# Patient Record
Sex: Female | Born: 1985 | Race: Black or African American | Hispanic: No | Marital: Single | Smoking: Never smoker
Health system: Southern US, Community
[De-identification: ages and names within clinical notes are randomized; demographics above are authoritative.]

## PROBLEM LIST (undated history)

## (undated) DIAGNOSIS — L309 Dermatitis, unspecified: Secondary | ICD-10-CM

## (undated) HISTORY — DX: Dermatitis, unspecified: L30.9

## (undated) HISTORY — PX: NO PAST SURGERIES: SHX2092

## (undated) HISTORY — PX: APPENDECTOMY: SHX54

---
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
## 2011-12-26 ENCOUNTER — Emergency Department (HOSPITAL_COMMUNITY)
Admission: EM | Admit: 2011-12-26 | Discharge: 2011-12-26 | Disposition: A | Discharge status: Home Health | Payer: Self-pay | Attending: Emergency Medicine | Admitting: Emergency Medicine

## 2011-12-26 ENCOUNTER — Encounter (HOSPITAL_COMMUNITY): Payer: Self-pay | Admitting: Nurse Practitioner

## 2011-12-26 DIAGNOSIS — K0889 Other specified disorders of teeth and supporting structures: Secondary | ICD-10-CM

## 2011-12-26 DIAGNOSIS — K029 Dental caries, unspecified: Secondary | ICD-10-CM | POA: Insufficient documentation

## 2011-12-26 MED ORDER — IBUPROFEN 200 MG PO TABS
600.0000 mg | ORAL_TABLET | Freq: Once | ORAL | Status: AC
  Administered 2011-12-26: 600 mg via ORAL
  Filled 2011-12-26: qty 1

## 2011-12-26 MED ORDER — PENICILLIN V POTASSIUM 250 MG PO TABS
500.0000 mg | ORAL_TABLET | Freq: Once | ORAL | Status: AC
  Administered 2011-12-26: 500 mg via ORAL
  Filled 2011-12-26: qty 2

## 2011-12-26 MED ORDER — PENICILLIN V POTASSIUM 500 MG PO TABS: 500.0000 mg | ORAL_TABLET | Freq: Three times a day (TID) | ORAL | Status: AC

## 2011-12-26 MED ORDER — IBUPROFEN 800 MG PO TABS: 800.0000 mg | ORAL_TABLET | Freq: Three times a day (TID) | ORAL | Status: AC

## 2011-12-26 NOTE — ED Notes (Signed)
Woke this am with top R tooth ache and facial swelling. No trouble breathing or swallowing

## 2011-12-26 NOTE — ED Provider Notes (Addendum)
History     CSN: [id number]  Arrival date & time 12/26/11  1416   First MD Initiated Contact with Patient 12/26/11 1507      Chief Complaint  Patient presents with  . Dental Pain    (Consider location/radiation/quality/duration/timing/severity/associated sxs/prior treatment) HPI Complaint toothache, right upper molar onset upon awakening this morning pain is mild at present . Associated symptoms included right-sided facial swelling no trismus no fever no other complaint nothing makes symptoms better or worse pain is nonradiating dull in quality no treatment prior to coming here History reviewed. No pertinent past medical history. Past medical. Negative History reviewed. No pertinent past surgical history.  History reviewed. No pertinent family history.  History  Substance Use Topics  . Smoking status: Never Smoker   . Smokeless tobacco: Not on file  . Alcohol Use: No    OB History    Grav Para Term Preterm Abortions TAB SAB Ect Mult Living                  Review of Systems  Constitutional: Negative.   HENT: Positive for facial swelling.        Toothache  Eyes: Negative.   Respiratory: Negative.   Gastrointestinal: Negative.   Musculoskeletal: Negative.   Skin: Negative.   Neurological: Negative.   Psychiatric/Behavioral: Negative.   All other systems reviewed and are negative.    Allergies  Review of patient's allergies indicates no known allergies.  Home Medications  No current outpatient prescriptions on file.  BP 123/81  Pulse 83  Temp(Src) 98.2 F (36.8 C) (Oral)  Resp 14  Ht 5\' 1"  (1.549 m)  Wt 115 lb (52.164 kg)  BMI 21.73 kg/m2  SpO2 100%  Physical Exam  Nursing note and vitals reviewed. Constitutional: She appears well-developed and well-nourished. No distress.  HENT:  Head: Normocephalic and atraumatic.       Right upper molar with dental carry, no redness or fluctuance of gingiva no trismus tooth is minimally tender, minimal swelling  of right cheek. No malocclusion  Eyes: Conjunctivae are normal. Pupils are equal, round, and reactive to light.  Neck: Neck supple. No tracheal deviation present. No thyromegaly present.  Cardiovascular: Normal rate.   Pulmonary/Chest: Effort normal.  Abdominal: She exhibits no distension.  Musculoskeletal: Normal range of motion. She exhibits no edema and no tenderness.  Lymphadenopathy:    She has no cervical adenopathy.  Neurological: She is alert. Coordination normal.  Skin: Skin is warm and dry. No rash noted.  Psychiatric: She has a normal mood and affect.    ED Course  Procedures (including critical care time)  Labs Reviewed - No data to display No results found.   No diagnosis found.    MDM  Suspect periapical abscess Plan prescriptions ibuprofen, penicillin. Dental referral Dr. Ninetta Lights Diagnosis toothache        Doug Sou, MD 12/26/11 1517  Doug Sou, MD 12/27/11 [id number]

## 2011-12-26 NOTE — Discharge Instructions (Signed)
Toothache Toothaches are usually caused by tooth decay (cavity). However, other causes of toothache include:  Gum disease.   Cracked tooth.   Cracked filling.   Injury.   Jaw problem (temporo mandibular joint or TMJ disorder).   Tooth abscess.   Root sensitivity.   Grinding.   Eruption problems.  Swelling and redness around a painful tooth often means you have a dental abscess. Pain medicine and antibiotics can help reduce symptoms, but you will need to see a dentist within the next few days to have your problem properly evaluated and treated. If tooth decay is the problem, you may need a filling or root canal to save your tooth. If the problem is more severe, your tooth may need to be pulled. SEEK IMMEDIATE MEDICAL CARE IF:  You cannot swallow.   You develop severe swelling, increased redness, or increased pain in your mouth or face.   You have a fever.   You cannot open your mouth adequately.  Document Released: 10/15/2004 Document Revised: 08/27/2011 Document Reviewed: 12/05/2009 [facility] Patient Information 2012 [place], [place].   Call Dr. Ninetta Lights in 2 days to get your tooth fixed. You must call in 2 days to qualify for possible help with payment of your dental bill

## 2014-05-10 ENCOUNTER — Emergency Department (HOSPITAL_COMMUNITY)
Admission: EM | Admit: 2014-05-10 | Discharge: 2014-05-10 | Disposition: A | Discharge status: Home / Self Care | Payer: Self-pay | Attending: Emergency Medicine | Admitting: Emergency Medicine

## 2014-05-10 ENCOUNTER — Encounter (HOSPITAL_COMMUNITY): Payer: Self-pay | Admitting: Emergency Medicine

## 2014-05-10 DIAGNOSIS — K047 Periapical abscess without sinus: Secondary | ICD-10-CM | POA: Insufficient documentation

## 2014-05-10 DIAGNOSIS — K089 Disorder of teeth and supporting structures, unspecified: Secondary | ICD-10-CM | POA: Insufficient documentation

## 2014-05-10 MED ORDER — OXYCODONE-ACETAMINOPHEN 5-325 MG PO TABS: ORAL_TABLET | ORAL | Status: DC

## 2014-05-10 MED ORDER — AMOXICILLIN 500 MG PO CAPS: 500.0000 mg | ORAL_CAPSULE | Freq: Three times a day (TID) | ORAL | Status: DC

## 2014-05-10 MED ORDER — IBUPROFEN 400 MG PO TABS
800.0000 mg | ORAL_TABLET | Freq: Once | ORAL | Status: AC
  Administered 2014-05-10: 800 mg via ORAL
  Filled 2014-05-10: qty 2

## 2014-05-10 NOTE — Discharge Instructions (Signed)
Take percocet for breakthrough pain, do not drink alcohol, drive, care for children or do other critical tasks while taking percocet. ° °Return to the emergency room for fever, change in vision, redness to the face that rapidly spreads towards the eye, nausea or vomiting, difficulty swallowing or shortness of breath. °  °Apply warm compresses to jaw throughout the day.  ° ° °Take your antibiotics as directed and to the end of the course. DO NOT drink alcohol when taking metronidazole, it will make you very sick!  ° °Followup with a dentist is very important for ongoing evaluation and management of recurrent dental pain. Return to emergency department for emergent changing or worsening symptoms." ° °Low-cost dental clinic: °**Kaylee  Gibson  at [phone]**  °**Kaylee Gibson at [phone] [address]**   ° °You may also call [phone] ° °Dental Assistance °If the dentist on-call cannot see you, please use the resources below: ° ° °Patients with Medicaid: [facility] °[address], [phone] °[address], [phone] ° °If unable to pay, or uninsured, contact [facility] ([phone]) or [facility] ([phone] in [place], [phone] in [place]) to become qualified for the adult dental clinic ° °Other Low-Cost Community Dental Services: °[facility]- [address], [place], [place], [address] °   [phone], Ext. [phone] °   2nd and 4th Thursday of the month at 6:30am °   10 clients each day by appointment, can sometimes see walk-in     patients if someone does not show for an appointment °[facility]- [address], [place], [place], [address] °   [phone] °[facility]- [address], [place], [place], [address] °   [phone] ° °[facility]- [phone] °[facility]- [phone] °[facility]- [phone] ° °

## 2014-05-10 NOTE — ED Provider Notes (Signed)
CSN: [id number]     Arrival date & time 05/10/14  1224 History   First MD Initiated Contact with Patient 05/10/14 1246     Chief Complaint  Patient presents with  . Dental Pain     (Consider location/radiation/quality/duration/timing/severity/associated sxs/prior Treatment) HPI  Kaylee Gibson is a 28 y.o. female complaining of pain and swelling to right upper jaw worsening over the course of 2 days. Patient denies change in vision, pain with eye movement, fever/chills, difficulty opening jaw, difficulty swallowing, SOB, gum swelling, facial swelling, neck swelling.    History reviewed. No pertinent past medical history. History reviewed. No pertinent past surgical history. History reviewed. No pertinent family history. History  Substance Use Topics  . Smoking status: Never Smoker   . Smokeless tobacco: Not on file  . Alcohol Use: No   OB History   Grav Para Term Preterm Abortions TAB SAB Ect Mult Living                 Review of Systems  10 systems reviewed and found to be negative, except as noted in the HPI.   Allergies  Review of patient's allergies indicates no known allergies.  Home Medications   Prior to Admission medications   Medication Sig Start Date End Date Taking? Authorizing Provider  amoxicillin (AMOXIL) 500 MG capsule Take 1 capsule (500 mg total) by mouth 3 (three) times daily. 05/10/14   Damion Kant, PA-C  oxyCODONE-acetaminophen (PERCOCET/ROXICET) 5-325 MG per tablet 1 to 2 tabs PO q6hrs  PRN for pain 05/10/14   [facility], PA-C   BP 110/73  Pulse 83  Temp(Src) 98.4 F (36.9 C) (Oral)  Resp 18  Ht 5\' 1"  (1.549 m)  Wt 106 lb (48.081 kg)  BMI 20.04 kg/m2  SpO2 100%  LMP 04/09/2014 Physical Exam  Nursing note and vitals reviewed. Constitutional: She is oriented to person, place, and time. She appears well-developed and well-nourished. No distress.  HENT:  Head: Normocephalic.  Mouth/Throat: Oropharynx is clear and moist.    Right  maxillary swelling, this is not extend to the eye.  Eyes: Conjunctivae and EOM are normal. Pupils are equal, round, and reactive to light.  Neck: Normal range of motion. Neck supple.  Cardiovascular: Normal rate, regular rhythm and intact distal pulses.   Pulmonary/Chest: Effort normal and breath sounds normal. No stridor. No respiratory distress. She has no wheezes. She has no rales. She exhibits no tenderness.  Abdominal: Soft. Bowel sounds are normal. She exhibits no distension and no mass. There is no tenderness. There is no rebound and no guarding.  Musculoskeletal: Normal range of motion.  Neurological: She is alert and oriented to person, place, and time.  Psychiatric: She has a normal mood and affect.    ED Course  Procedures (including critical care time)  INCISION AND DRAINAGE Performed by: Wynetta Emery Consent: Verbal consent obtained. Risks and benefits: risks, benefits and alternatives were discussed Type: abscess  Body area: Right maxillary  Anesthesia: local infiltration  Incision was made with a scalpel.  Local anesthetic: lidocaine 2% with epinephrine  Anesthetic total: 0.5 ml  Complexity: complex Blunt dissection to break up loculations  Drainage: purulent  Drainage amount: Profuse   Packing material: None   Patient tolerance: Patient tolerated the procedure well with no immediate complications.    Labs Review Labs Reviewed - No data to display  Imaging Review No results found.   EKG Interpretation None      MDM   Final diagnoses:  Dental abscess    Filed Vitals:   05/10/14 1242 05/10/14 1408  BP: 109/65 110/73  Pulse: 91 83  Temp: 98.4 F (36.9 C)   TempSrc: Oral   Resp: 20 18  Height: 5\' 1"  (1.549 m)   Weight: 106 lb (48.081 kg)   SpO2: 100% 100%    Medications  ibuprofen (ADVIL,MOTRIN) tablet 800 mg (800 mg Oral Given 05/10/14 1421)    Kaylee Gibson is a 28 y.o. female presenting with dental pain and dental  abscess to right maxilla. I&D performed with expression of a large amount of purulent fluid. Patient will be started on amoxicillin and asked to follow with dentistry.  Evaluation does not show pathology that would require ongoing emergent intervention or inpatient treatment. Pt is hemodynamically stable and mentating appropriately. Discussed findings and plan with patient/guardian, who agrees with care plan. All questions answered. Return precautions discussed and outpatient follow up given.   Discharge Medication List as of 05/10/2014  1:51 PM    START taking these medications   Details  amoxicillin (AMOXIL) 500 MG capsule Take 1 capsule (500 mg total) by mouth 3 (three) times daily., Starting 05/10/2014, Until Discontinued, Print    oxyCODONE-acetaminophen (PERCOCET/ROXICET) 5-325 MG per tablet 1 to 2 tabs PO q6hrs  PRN for pain, Print             Wynetta Emeryicole Neya Creegan, PA-C 05/10/14 1653

## 2014-05-10 NOTE — ED Notes (Signed)
Pt c/o of dental pain on upper right side of mouth for past 2 days. Pt noted to have swelling on R side. Pt rates pain 9/10. Pt sts she has taken goody's powder to try and relieve pain. Pt sts goody's relieved a small amount of pain. Pt sts she has had diarrhea when pain first started on Tuesday.

## 2014-05-12 NOTE — ED Provider Notes (Signed)
Medical screening examination/treatment/procedure(s) were performed by non-physician practitioner and as supervising physician I was immediately available for consultation/collaboration.   EKG Interpretation None        Pecola Haxton, DO 05/12/14 1556 

## 2014-12-09 ENCOUNTER — Emergency Department (HOSPITAL_COMMUNITY)
Admission: EM | Admit: 2014-12-09 | Discharge: 2014-12-09 | Disposition: A | Discharge status: Home / Self Care | Payer: Self-pay | Attending: Emergency Medicine | Admitting: Emergency Medicine

## 2014-12-09 ENCOUNTER — Encounter (HOSPITAL_COMMUNITY): Payer: Self-pay | Admitting: *Deleted

## 2014-12-09 DIAGNOSIS — R21 Rash and other nonspecific skin eruption: Secondary | ICD-10-CM | POA: Insufficient documentation

## 2014-12-09 DIAGNOSIS — Z792 Long term (current) use of antibiotics: Secondary | ICD-10-CM | POA: Insufficient documentation

## 2014-12-09 MED ORDER — HYDROCORTISONE 1 % EX CREA: TOPICAL_CREAM | CUTANEOUS | Status: DC

## 2014-12-09 MED ORDER — DIPHENHYDRAMINE HCL 25 MG PO TABS: 25.0000 mg | ORAL_TABLET | Freq: Four times a day (QID) | ORAL | Status: DC

## 2014-12-09 NOTE — ED Provider Notes (Signed)
CSN: [id number]     Arrival date & time 12/09/14  1445 History  This chart was scribed for non-physician practitioner, , Trixie Dredge, PA-C, working with Samuel Jester, DO, by Modena Jansky, ED Scribe. This patient was seen in room TR08C/TR08C and the patient's care was started at 3:13 PM.  Chief Complaint  Patient presents with  . Rash    face   The history is provided by the patient. No language interpreter was used.    HPI Comments: Kaylee Gibson is a 29 y.o. female who presents to the Emergency Department complaining of a constant moderate rash that started about a week ago. She reports that she is unsure of the cause of the rash. She describes the rash as itchy and goes across her cheek and nose. She denies any pain, swelling or rashes anywhere else.  Denies any pain associated with the rash.  No itching or swelling in the mouth or throat, no difficulty swallowing or breathing. Denies changes in personal care products including detergents, soaps, shampoos, lotions, perfumes. Denies new clothing or furniture.  Denies travel, visiting other people's houses.  Denies any recent camping or time spent in the woods.  Denies known tick bites.  Denies chemical or plant exposures.  Denies new foods.  Denies any new medications or medication changes.    History reviewed. No pertinent past medical history. History reviewed. No pertinent past surgical history. History reviewed. No pertinent family history. History  Substance Use Topics  . Smoking status: Never Smoker   . Smokeless tobacco: Not on file  . Alcohol Use: No   OB History    No data available     Review of Systems  Constitutional: Negative for fever and chills.  HENT: Negative for facial swelling, sore throat and trouble swallowing.   Respiratory: Negative for cough, shortness of breath and stridor.   Skin: Positive for rash. Negative for color change and wound.  Allergic/Immunologic: Negative for immunocompromised state.   Neurological: Negative for headaches.  Hematological: Does not bruise/bleed easily.  Psychiatric/Behavioral: Negative for self-injury.      Allergies  Review of patient's allergies indicates no known allergies.  Home Medications   Prior to Admission medications   Medication Sig Start Date End Date Taking? Authorizing Provider  amoxicillin (AMOXIL) 500 MG capsule Take 1 capsule (500 mg total) by mouth 3 (three) times daily. 05/10/14   Nicole Pisciotta, PA-C  oxyCODONE-acetaminophen (PERCOCET/ROXICET) 5-325 MG per tablet 1 to 2 tabs PO q6hrs  PRN for pain 05/10/14   Nicole Pisciotta, PA-C   BP 108/90 mmHg  Pulse 84  Temp(Src) 98.1 F (36.7 C) (Oral)  Resp 16  Ht  (1.549 m)  Wt 102 lb (46.267 kg)  BMI 19.28 kg/m2  SpO2 10%  LMP 12/02/2014 Physical Exam  Constitutional: She appears well-developed and well-nourished. No distress.  HENT:  Head: Normocephalic and atraumatic.  Mouth/Throat: Oropharynx is clear and moist. No oropharyngeal exudate.  No edema in oropharynx.   Eyes: Conjunctivae and EOM are normal.  Neck: Normal range of motion. Neck supple.  Cardiovascular: Normal rate and regular rhythm.   Pulmonary/Chest: Effort normal. No stridor. No respiratory distress. She has no wheezes. She has no rales.  Lymphadenopathy:    She has no cervical adenopathy.  Neurological: She is alert.  Skin: Rash noted. She is not diaphoretic.  Fine papules across the forehead, and along the bridge of nose and bilateral cheeks.   Psychiatric: She has a normal mood and affect. Her behavior  is normal.  Nursing note and vitals reviewed.   ED Course  Procedures (including critical care time) DIAGNOSTIC STUDIES:   COORDINATION OF CARE: 3:17 PM- Pt advised of plan for treatment and pt agrees.  Labs Review Labs Reviewed - No data to display  Imaging Review No results found.   EKG Interpretation None      MDM   Final diagnoses:  Facial rash    Afebrile, nontoxic  patient with pruritic rash across nose and cheeks and forehead.  No e/o superinfection.  No airway concerns.  Unclear etiology.  No systemic symptoms.   D/C home with hydrocortisone, benadryl.  PCP follow up.  Discussed result, findings, treatment, and follow up  with patient.  Pt given return precautions.  Pt verbalizes understanding and agrees with plan.        I personally performed the services described in this documentation, which was scribed in my presence. The recorded information has been reviewed and is accurate.      Trixie Dredgemily Aodhan Scheidt, PA-C 12/09/14 1534  Samuel JesterKathleen McManus, DO 12/10/14 1340

## 2014-12-09 NOTE — ED Notes (Signed)
Declined W/C at D/C and was escorted to lobby by RN. 

## 2014-12-09 NOTE — ED Notes (Signed)
Pt reports a rash on face for one week .pt reports itching .

## 2014-12-09 NOTE — Discharge Instructions (Signed)
Read the information below.  Use the prescribed medication as directed.  Please discuss all new medications with your pharmacist.  You may return to the Emergency Department at any time for worsening condition or any new symptoms that concern you.   If you develop redness, swelling, pus draining from the wound, or fevers greater than 100.4, return to the ER immediately for a recheck.  If you develop high fevers, difficulty swallowing or breathing, or you are unable to tolerate fluids by mouth, return to the ER immediately for a recheck.      Rash A rash is a change in the color or feel of your skin. There are many different types of rashes. You may have other problems along with your rash. HOME CARE  Avoid the thing that caused your rash.  Do not scratch your rash.  You may take cools baths to help stop itching.  Only take medicines as told by your doctor.  Keep all doctor visits as told. GET HELP RIGHT AWAY IF:   Your pain, puffiness (swelling), or redness gets worse.  You have a fever.  You have new or severe problems.  You have body aches, watery poop (diarrhea), or you throw up (vomit).  Your rash is not better after 3 days. MAKE SURE YOU:   Understand these instructions.  Will watch your condition.  Will get help right away if you are not doing well or get worse. Document Released: 02/24/2008 Document Revised: 11/30/2011 Document Reviewed: 06/22/2011 [facility] Patient Information 2015 [place]. This information is not intended to replace advice given to you by your health care provider. Make sure you discuss any questions you have with your health care provider.

## 2015-04-07 ENCOUNTER — Encounter (HOSPITAL_COMMUNITY): Payer: Self-pay | Admitting: Emergency Medicine

## 2015-04-07 ENCOUNTER — Emergency Department (HOSPITAL_COMMUNITY)
Admission: EM | Admit: 2015-04-07 | Discharge: 2015-04-07 | Disposition: A | Discharge status: Home / Self Care | Payer: Self-pay | Attending: Emergency Medicine | Admitting: Emergency Medicine

## 2015-04-07 DIAGNOSIS — L500 Allergic urticaria: Secondary | ICD-10-CM | POA: Insufficient documentation

## 2015-04-07 DIAGNOSIS — X58XXXA Exposure to other specified factors, initial encounter: Secondary | ICD-10-CM | POA: Insufficient documentation

## 2015-04-07 DIAGNOSIS — Y9389 Activity, other specified: Secondary | ICD-10-CM | POA: Insufficient documentation

## 2015-04-07 DIAGNOSIS — Z792 Long term (current) use of antibiotics: Secondary | ICD-10-CM | POA: Insufficient documentation

## 2015-04-07 DIAGNOSIS — Z79899 Other long term (current) drug therapy: Secondary | ICD-10-CM | POA: Insufficient documentation

## 2015-04-07 DIAGNOSIS — Y9289 Other specified places as the place of occurrence of the external cause: Secondary | ICD-10-CM | POA: Insufficient documentation

## 2015-04-07 DIAGNOSIS — T7840XA Allergy, unspecified, initial encounter: Secondary | ICD-10-CM

## 2015-04-07 DIAGNOSIS — Y998 Other external cause status: Secondary | ICD-10-CM | POA: Insufficient documentation

## 2015-04-07 MED ORDER — HYDROXYZINE HCL 10 MG PO TABS
10.0000 mg | ORAL_TABLET | Freq: Once | ORAL | Status: AC
  Administered 2015-04-07: 10 mg via ORAL
  Filled 2015-04-07: qty 1

## 2015-04-07 MED ORDER — PREDNISONE 20 MG PO TABS
40.0000 mg | ORAL_TABLET | Freq: Every day | ORAL | Status: DC
Start: 2015-04-07 — End: 2016-01-16

## 2015-04-07 MED ORDER — FAMOTIDINE IN NACL 20-0.9 MG/50ML-% IV SOLN
20.0000 mg | Freq: Once | INTRAVENOUS | Status: AC
  Administered 2015-04-07: 20 mg via INTRAVENOUS
  Filled 2015-04-07: qty 50

## 2015-04-07 MED ORDER — METHYLPREDNISOLONE SODIUM SUCC 125 MG IJ SOLR
125.0000 mg | Freq: Once | INTRAMUSCULAR | Status: AC
  Administered 2015-04-07: 125 mg via INTRAVENOUS
  Filled 2015-04-07: qty 2

## 2015-04-07 NOTE — ED Provider Notes (Signed)
CSN: [id number]     Arrival date & time 04/07/15  1823 History   First MD Initiated Contact with Patient 04/07/15 1828     Chief Complaint  Patient presents with  . Allergic Reaction  . Rash     (Consider location/radiation/quality/duration/timing/severity/associated sxs/prior Treatment) HPI Comments: Patient presents to the emergency department with chief complaint of allergic reaction. She states that when she woke today, she had a rash on her left bicep. She dates that it then spread to her bilateral upper and lower extremities and to her trunk. She states that it is very itchy. She tried taking 50 mg of Benadryl about an hour ago with no relief. She denies any difficulty breathing. Denies difficulty swallowing or speaking. She denies any associated nausea, vomiting, or diarrhea. Denies any known allergies. Denies any allergic contacts. Denies any new soaps, detergents, or other attentional sources of contact dermatitis.  The history is provided by the patient. No language interpreter was used.    History reviewed. No pertinent past medical history. History reviewed. No pertinent past surgical history. No family history on file. History  Substance Use Topics  . Smoking status: Never Smoker   . Smokeless tobacco: Not on file  . Alcohol Use: No   OB History    No data available     Review of Systems  Constitutional: Negative for fever and chills.  Respiratory: Negative for shortness of breath.   Cardiovascular: Negative for chest pain.  Gastrointestinal: Negative for nausea, vomiting, diarrhea and constipation.  Genitourinary: Negative for dysuria.  Skin: Positive for rash.  All other systems reviewed and are negative.     Allergies  Review of patient's allergies indicates no known allergies.  Home Medications   Prior to Admission medications   Medication Sig Start Date End Date Taking? Authorizing Provider  amoxicillin (AMOXIL) 500 MG capsule Take 1 capsule (500 mg  total) by mouth 3 (three) times daily. 05/10/14   Nicole Pisciotta, PA-C  diphenhydrAMINE (BENADRYL) 25 MG tablet Take 1 tablet (25 mg total) by mouth every 6 (six) hours. 12/09/14   Trixie DredgeEmily West, PA-C  hydrocortisone cream 1 % Apply to affected area 2 times daily 12/09/14   Trixie DredgeEmily West, PA-C  oxyCODONE-acetaminophen (PERCOCET/ROXICET) 5-325 MG per tablet 1 to 2 tabs PO q6hrs  PRN for pain 05/10/14   Joni ReiningNicole Pisciotta, PA-C   BP 111/79 mmHg  Pulse 87  Temp(Src) 98.2 F (36.8 C) (Oral)  Resp 12  SpO2 100%  LMP 03/17/2015 (Approximate) Physical Exam  Constitutional: She is oriented to person, place, and time. She appears well-developed and well-nourished.  HENT:  Head: Normocephalic and atraumatic.  Oropharynx is clear, no edema, no stridor, airway is intact  Eyes: Conjunctivae and EOM are normal. Pupils are equal, round, and reactive to light.  Neck: Normal range of motion. Neck supple.  Cardiovascular: Normal rate and regular rhythm.  Exam reveals no gallop and no friction rub.   No murmur heard. Pulmonary/Chest: Effort normal and breath sounds normal. No respiratory distress. She has no wheezes. She has no rales. She exhibits no tenderness.  Clear to auscultation bilaterally  Abdominal: Soft. Bowel sounds are normal. She exhibits no distension and no mass. There is no tenderness. There is no rebound and no guarding.  Musculoskeletal: Normal range of motion. She exhibits no edema or tenderness.  Neurological: She is alert and oriented to person, place, and time.  Skin: Skin is warm and dry.  Diffuse urticaria on upper and lower extremities and trunk  Psychiatric: She has a normal mood and affect. Her behavior is normal. Judgment and thought content normal.  Nursing note and vitals reviewed.   ED Course  Procedures (including critical care time) Labs Review Labs Reviewed - No data to display  Imaging Review No results found.   EKG Interpretation None      MDM   Final diagnoses:   Allergic reaction, initial encounter    Patient with diffuse urticaria on extremities and on trunk. Airway is intact, no respiratory distress, no other systems involved other than the skin. No evidence of anaphylaxis. Will treat with Solu-Medrol and Pepcid IV. Patient has taken Benadryl.  8:40 PM patient reassessed, she states that she is feeling well. The itching has resolved. Urticaria has resolved. Will discharge to home with instructions to continue Benadryl and to take prednisone for the next 5 days. Return precautions discussed. Patient understands and agrees the plan. She is stable and ready for discharge.    Roxy Horseman, PA-C 04/07/15 1610  Derwood Kaplan, MD 04/11/15 [phone]

## 2015-04-07 NOTE — ED Notes (Signed)
PA at bedside.

## 2015-04-07 NOTE — ED Notes (Signed)
Pt arrives via ems for c/o possible allergic reaction with red, itchy rash on legs and torso. Pt denies sob or feelings of her throat swelling. Pt did take  benadryl prior to arrival. Alert, oriented, nad.

## 2015-04-07 NOTE — Discharge Instructions (Signed)
Hives Hives are itchy, red, swollen areas of the skin. They can vary in size and location on your body. Hives can come and go for hours or several days (acute hives) or for several weeks (chronic hives). Hives do not spread from person to person (noncontagious). They may get worse with scratching, exercise, and emotional stress. CAUSES   Allergic reaction to food, additives, or drugs.  Infections, including the common cold.  Illness, such as vasculitis, lupus, or thyroid disease.  Exposure to sunlight, heat, or cold.  Exercise.  Stress.  Contact with chemicals. SYMPTOMS   Red or white swollen patches on the skin. The patches may change size, shape, and location quickly and repeatedly.  Itching.  Swelling of the hands, feet, and face. This may occur if hives develop deeper in the skin. DIAGNOSIS  Your caregiver can usually tell what is wrong by performing a physical exam. Skin or blood tests may also be done to determine the cause of your hives. In some cases, the cause cannot be determined. TREATMENT  Mild cases usually get better with medicines such as antihistamines. Severe cases may require an emergency epinephrine injection. If the cause of your hives is known, treatment includes avoiding that trigger.  HOME CARE INSTRUCTIONS   Avoid causes that trigger your hives.  Take antihistamines as directed by your caregiver to reduce the severity of your hives. Non-sedating or low-sedating antihistamines are usually recommended. Do not drive while taking an antihistamine.  Take any other medicines prescribed for itching as directed by your caregiver.  Wear loose-fitting clothing.  Keep all follow-up appointments as directed by your caregiver. SEEK MEDICAL CARE IF:   You have persistent or severe itching that is not relieved with medicine.  You have painful or swollen joints. SEEK IMMEDIATE MEDICAL CARE IF:   You have a fever.  Your tongue or lips are swollen.  You have  trouble breathing or swallowing.  You feel tightness in the throat or chest.  You have abdominal pain. These problems may be the first sign of a life-threatening allergic reaction. Call your local emergency services (911 in [place] MAKE SURE YOU:   Understand these instructions.  Will watch your condition.  Will get help right away if you are not doing well or get worse. Document Released: 09/07/2005 Document Revised: 09/12/2013 Document Reviewed: 12/01/2011 Exit[employer] Patient Information 2015 [employer]. This information is not intended to replace advice given to you by your health care provider. Make sure you discuss any questions you have with your health care provider.  

## 2016-01-14 ENCOUNTER — Ambulatory Visit: Payer: Self-pay | Admitting: Internal Medicine

## 2016-01-15 ENCOUNTER — Telehealth: Payer: Self-pay | Admitting: General Practice

## 2016-01-15 NOTE — Telephone Encounter (Signed)
APPT. REMINDER CALL, LMTCB °

## 2016-01-16 ENCOUNTER — Ambulatory Visit (INDEPENDENT_AMBULATORY_CARE_PROVIDER_SITE_OTHER): Payer: Self-pay | Admitting: Internal Medicine

## 2016-01-16 ENCOUNTER — Encounter: Payer: Self-pay | Admitting: Internal Medicine

## 2016-01-16 VITALS — BP 107/73 | HR 78 | Temp 98.2°F | Ht 61.9 in | Wt 100.6 lb

## 2016-01-16 DIAGNOSIS — L209 Atopic dermatitis, unspecified: Secondary | ICD-10-CM

## 2016-01-16 DIAGNOSIS — Z0000 Encounter for general adult medical examination without abnormal findings: Secondary | ICD-10-CM

## 2016-01-16 DIAGNOSIS — R21 Rash and other nonspecific skin eruption: Secondary | ICD-10-CM

## 2016-01-16 MED ORDER — CETIRIZINE HCL 10 MG PO CAPS: 10.0000 | ORAL_CAPSULE | Freq: Every morning | ORAL | Status: DC

## 2016-01-16 MED ORDER — HYDROCORTISONE 0.5 % EX CREA: 1.0000 "application " | TOPICAL_CREAM | Freq: Three times a day (TID) | CUTANEOUS | Status: DC

## 2016-01-16 MED ORDER — HYDROXYZINE HCL 10 MG PO TABS: 10.0000 mg | ORAL_TABLET | Freq: Three times a day (TID) | ORAL | Status: DC | PRN

## 2016-01-16 MED ORDER — CETIRIZINE HCL 10 MG PO CAPS: 1.0000 | ORAL_CAPSULE | Freq: Every morning | ORAL | Status: DC

## 2016-01-16 NOTE — Patient Instructions (Addendum)
Take zyrtec 10mg  daily every day.   You can use atarax 10mg  up to three times a day as needed for itching.   Apply hydrocortisone 0.5% cream to rash three times a day, do not use for longer than 1 month. Once you apply hydrocortisone you can place a dry dressing over it to keep it covered so that you do not scratch it.

## 2016-01-16 NOTE — Assessment & Plan Note (Signed)
Pt presenting w/ confluent papular rash in the center of forehead and on cheeks beneath eyes that she has had for 1 year. She admits to constantly scratching it w/o thinking about it. She has tried topical and oral benadryl which has not helped. She has been prescribed steroids in the past but she said that these did not help w/ rash. Rash is not scaling and spares nasolabial fold and there is not any scaling around her scalp or eye brows that would suggest seborrheic dermatitis. Rash is smooth and sun exposure only causes rash to itch more and not actually worsen rash thus unlikely lupus. Unlikely eczema, as she doesn't have any rashes on flexural surfaces of her body.  She has hx of allergies since she was a child and does not take a daily allergy med. Likely she has atopic dermatitis vs lichen simplex chronicus from constant scratching.   - rx for zyrtec 10mg  daily, hydrocortisone 0.05% applied topically TID, and atarax 10mg  TID prn for itch - advised to put a dry dressing over rash after applying steroid cream to prevent rubbing of skin

## 2016-01-16 NOTE — Progress Notes (Signed)
Subjective:   Patient ID: Kaylee Gibson female   DOB: 07-Aug-1986 30 y.o.   MRN: [id number]  HPI: [name] is a 30 y.o. with past medical history as outlined below who presents to clinic to establish care. She has a chronic facial rash that has been present x 1 year. She has been seen in the ED for this at least once a year per EPIC review and has tried po and topical steroids. She has also tried benadryl  BID po as well as topical form which has not helped. She has had allergies since she was a child and is not taking any anti histamine for this. She endorses itchy throat and eyes, eye redness, sneezing, and rhinorrhea associated w/ her allergies. Wynelle Link exposure makes her skin rash itchy but does not worsen it. She has no other rashes on her body. She does not smoke, does not have any pets, and is not aware of any house mold. She does not wear any make up and washes her face with dial soap.   SH: unemployed, lives w/ mother. Not sexually active. Does not smoke, drink EtOH, or use illicit drugs Family hx: niece and nephew have asthma, aunt has bronchitis. Neg for DM, HTN, bleeding d/o, and lupus Surgical hx: none PMHx: none except asthma  Please see problem list for status of the pt's chronic medical problems.  No past medical history on file. Current Outpatient Prescriptions  Medication Sig Dispense Refill  . Cetirizine HCl 10 MG CAPS Take 1 capsule (10 mg total) by mouth every morning. 30 capsule 11  . hydrocortisone cream 0.5 % Apply 1 application topically 3 (three) times daily. 30 g 0  . hydrOXYzine (ATARAX/VISTARIL) 10 MG tablet Take 1 tablet (10 mg total) by mouth 3 (three) times daily as needed. 30 tablet 0   No current facility-administered medications for this visit.   No family history on file. Social History   Social History  . Marital Status: Single    Spouse Name: N/A  . Number of Children: N/A  . Years of Education: N/A   Social History Main Topics  .  Smoking status: Never Smoker   . Smokeless tobacco: Not on file  . Alcohol Use: No  . Drug Use: No  . Sexual Activity: Not on file   Other Topics Concern  . Not on file   Social History Narrative   Review of Systems: Review of Systems  Constitutional: Negative for fever, chills, weight loss and malaise/fatigue.  Eyes: Positive for redness (associated w/ allergies). Negative for blurred vision.  Respiratory: Negative for shortness of breath.   Cardiovascular: Positive for palpitations (occasional). Negative for chest pain.  Gastrointestinal: Negative for nausea, vomiting, abdominal pain, diarrhea and constipation.  Skin: Positive for itching and rash (chronic skin rash x 1 year on face only).  Neurological: Negative for focal weakness, weakness and headaches.  Endo/Heme/Allergies: Positive for environmental allergies (states she has had allergies since she was a child).    Objective:  Physical Exam: Filed Vitals:   01/16/16 1329  BP: 107/73  Pulse: 78  Temp: 98.2 F (36.8 C)  TempSrc: Oral  Height: 5' 1.9" (1.572 m)  Weight: 100 lb 9.6 oz (45.632 kg)  SpO2: 100%   Physical Exam  Constitutional: She appears well-developed and well-nourished. No distress.  HENT:  Head: Normocephalic and atraumatic.  Nose: Nose normal.  Mouth/Throat: Oropharynx is clear and moist. No oropharyngeal exudate.  Eyes: EOM are normal.  Injected conjunctiva on  the medial side b/l   Cardiovascular: Normal rate, regular rhythm and normal heart sounds.  Exam reveals no gallop and no friction rub.   No murmur heard. Pulmonary/Chest: Effort normal and breath sounds normal. No respiratory distress. She has no wheezes. She has no rales.  Abdominal: Soft. Bowel sounds are normal. She exhibits no distension. There is no tenderness. There is no rebound and no guarding.  Skin: Skin is warm and dry. Rash (multiple confluent papules on center of forehead and benealth eyes b/l. neg for scaling of skin, rash  is smooth and does not affect nasolabial folds.) noted. She is not diaphoretic. No erythema. No pallor.  Psychiatric: She has a normal mood and affect. Her behavior is normal. Judgment and thought content normal.   Assessment & Plan:   Please see problem based assessment and plan.

## 2016-01-16 NOTE — Assessment & Plan Note (Signed)
Pt has never had a pap smear. She is currently on her menstrual cycle, denies irregular menses. Advised to schedule an appointment for pap smear.

## 2016-01-19 NOTE — Progress Notes (Signed)
Case discussed with Dr. Truong at the time of the visit.  We reviewed the resident's history and exam and pertinent patient test results.  I agree with the assessment, diagnosis, and plan of care documented in the resident's note. 

## 2016-02-04 ENCOUNTER — Ambulatory Visit: Payer: Self-pay | Admitting: Internal Medicine

## 2016-02-13 ENCOUNTER — Ambulatory Visit: Payer: Self-pay | Admitting: Internal Medicine

## 2016-02-18 ENCOUNTER — Telehealth: Payer: Self-pay | Admitting: *Deleted

## 2016-02-18 NOTE — Telephone Encounter (Signed)
Message and return call to patient.  Patient would like to schedule an appointment for a Pap Smear.  [facility]  Angelina OkGladys Terrick Allred, RN 02/18/2016 1:43 PM.

## 2016-03-08 ENCOUNTER — Ambulatory Visit (HOSPITAL_COMMUNITY)
Admission: EM | Admit: 2016-03-08 | Discharge: 2016-03-08 | Disposition: A | Discharge status: Home / Self Care | Payer: Self-pay | Attending: Family Medicine | Admitting: Family Medicine

## 2016-03-08 ENCOUNTER — Encounter (HOSPITAL_COMMUNITY): Payer: Self-pay | Admitting: Emergency Medicine

## 2016-03-08 DIAGNOSIS — T700XXA Otitic barotrauma, initial encounter: Secondary | ICD-10-CM

## 2016-03-08 DIAGNOSIS — R05 Cough: Secondary | ICD-10-CM

## 2016-03-08 DIAGNOSIS — R059 Cough, unspecified: Secondary | ICD-10-CM

## 2016-03-08 DIAGNOSIS — J302 Other seasonal allergic rhinitis: Secondary | ICD-10-CM

## 2016-03-08 DIAGNOSIS — R0982 Postnasal drip: Secondary | ICD-10-CM

## 2016-03-08 MED ORDER — PREDNISONE 20 MG PO TABS
ORAL_TABLET | ORAL | Status: DC
Start: 2016-03-08 — End: 2017-11-02

## 2016-03-08 NOTE — ED Provider Notes (Signed)
CSN: [id number]     Arrival date & time 03/08/16  1807 History   First MD Initiated Contact with Patient 03/08/16 1916     Chief Complaint  Patient presents with  . Sore Throat  . Otalgia  . Cough   (Consider location/radiation/quality/duration/timing/severity/associated sxs/prior Treatment) HPI Comments: 30 year old female complaining of PND, headache, head fullness, earaches, cough for 3 days. Denies fevers.  Patient is a 30 y.o. female presenting with pharyngitis, ear pain, and cough.  Sore Throat Pertinent negatives include no shortness of breath.  Otalgia Associated symptoms: congestion, cough, rhinorrhea and sore throat   Associated symptoms: no fever, no neck pain and no rash   Cough Associated symptoms: ear pain, rhinorrhea and sore throat   Associated symptoms: no chills, no fever, no rash and no shortness of breath     History reviewed. No pertinent past medical history. History reviewed. No pertinent past surgical history. History reviewed. No pertinent family history. Social History  Substance Use Topics  . Smoking status: Never Smoker   . Smokeless tobacco: None  . Alcohol Use: No   OB History    No data available     Review of Systems  Constitutional: Negative for fever, chills, activity change, appetite change and fatigue.  HENT: Positive for congestion, ear pain, postnasal drip, rhinorrhea, sinus pressure and sore throat. Negative for facial swelling.   Eyes: Negative.   Respiratory: Positive for cough. Negative for shortness of breath.   Cardiovascular: Negative.   Musculoskeletal: Negative for neck pain and neck stiffness.  Skin: Negative for pallor and rash.  Neurological: Negative.     Allergies  Review of patient's allergies indicates no known allergies.  Home Medications   Prior to Admission medications   Medication Sig Start Date End Date Taking? Authorizing Provider  Cetirizine HCl 10 MG CAPS Take 1 capsule (10 mg total) by mouth every  morning. 01/16/16   Denton Brickiana M Truong, MD  hydrocortisone cream 0.5 % Apply 1 application topically 3 (three) times daily. 01/16/16   Denton Brickiana M Truong, MD  hydrOXYzine (ATARAX/VISTARIL) 10 MG tablet Take 1 tablet (10 mg total) by mouth 3 (three) times daily as needed. 01/16/16   Denton Brickiana M Truong, MD  predniSONE (DELTASONE) 20 MG tablet Take 2 tabs po on first day, 2 tabs second day, 2 tabs third day, 1 tab fourth day, 1 tab 5th day. Take with food. 03/08/16   Hayden Rasmussenavid Sitlali Koerner, NP   Meds Ordered and Administered this Visit  Medications - No data to display  BP 120/83 mmHg  Pulse 97  Temp(Src) 99.5 F (37.5 C) (Oral)  Resp 12  SpO2 100%  LMP 02/23/2016 (Exact Date) No data found.   Physical Exam  Constitutional: She is oriented to person, place, and time. She appears well-developed and well-nourished. No distress.  HENT:  Mouth/Throat: No oropharyngeal exudate.  Bilateral TMs are pearly grey entrance lucent. Retracted. No erythema or effusion. Oropharynx with minor erythema and cobblestoning with mild amount of clear PND. No exudates  Eyes: Conjunctivae and EOM are normal.  Neck: Normal range of motion. Neck supple.  Cardiovascular: Normal rate, regular rhythm and normal heart sounds.   Pulmonary/Chest: Effort normal and breath sounds normal. No respiratory distress. She has no wheezes. She has no rales.  Musculoskeletal: Normal range of motion. She exhibits no edema.  Lymphadenopathy:    She has no cervical adenopathy.  Neurological: She is alert and oriented to person, place, and time.  Skin: Skin is warm and dry. No rash  noted.  Psychiatric: She has a normal mood and affect.  Nursing note and vitals reviewed.   ED Course  Procedures (including critical care time)  Labs Review Labs Reviewed - No data to display  Imaging Review No results found.   Visual Acuity Review  Right Eye Distance:   Left Eye Distance:   Bilateral Distance:    Right Eye Near:   Left Eye Near:    Bilateral  Near:         MDM   1. Other seasonal allergic rhinitis   2. PND (post-nasal drip)   3. Cough   4. Barotitis media, initial encounter    For nasal and head congestion may take Sudafed PE 10 mg every 4 hours as needed. Saline nasal spray used frequently. For drainage may use Allegra, Claritin or Zyrtec. If you need stronger medicine to stop drainage may take Chlor-Trimeton 2-4 mg every 4 hours. This may cause drowsiness. Ibuprofen 400 mg every 6 hours as needed for pain, discomfort or fever. Meds ordered this encounter  Medications  . predniSONE (DELTASONE) 20 MG tablet    Sig: Take 2 tabs po on first day, 2 tabs second day, 2 tabs third day, 1 tab fourth day, 1 tab 5th day. Take with food.    Dispense:  8 tablet    Refill:  0    Order Specific Question:  Supervising Provider    Answer:  Linna Hoff [[id number]]       Hayden Rasmussen, NP 03/08/16 2023

## 2016-03-08 NOTE — Discharge Instructions (Signed)
Allergic Rhinitis For nasal and head congestion may take Sudafed PE 10 mg every 4 hours as needed. Saline nasal spray used frequently. For drainage may use Allegra, Claritin or Zyrtec. If you need stronger medicine to stop drainage may take Chlor-Trimeton 2-4 mg every 4 hours. This may cause drowsiness. Ibuprofen 400 mg every 6 hours as needed for pain, discomfort or fever. Drink plenty of fluids and stay well-hydrated.  Allergic rhinitis is when the mucous membranes in the nose respond to allergens. Allergens are particles in the air that cause your body to have an allergic reaction. This causes you to release allergic antibodies. Through a chain of events, these eventually cause you to release histamine into the blood stream. Although meant to protect the body, it is this release of histamine that causes your discomfort, such as frequent sneezing, congestion, and an itchy, runny nose.  CAUSES Seasonal allergic rhinitis (hay fever) is caused by pollen allergens that may come from grasses, trees, and weeds. Year-round allergic rhinitis (perennial allergic rhinitis) is caused by allergens such as house dust mites, pet dander, and mold spores. SYMPTOMS  Nasal stuffiness (congestion).  Itchy, runny nose with sneezing and tearing of the eyes. DIAGNOSIS Your health care provider can help you determine the allergen or allergens that trigger your symptoms. If you and your health care provider are unable to determine the allergen, skin or blood testing may be used. Your health care provider will diagnose your condition after taking your health history and performing a physical exam. Your health care provider may assess you for other related conditions, such as asthma, pink eye, or an ear infection. TREATMENT Allergic rhinitis does not have a cure, but it can be controlled by:  Medicines that block allergy symptoms. These may include allergy shots, nasal sprays, and oral antihistamines.  Avoiding the  allergen. Hay fever may often be treated with antihistamines in pill or nasal spray forms. Antihistamines block the effects of histamine. There are over-the-counter medicines that may help with nasal congestion and swelling around the eyes. Check with your health care provider before taking or giving this medicine. If avoiding the allergen or the medicine prescribed do not work, there are many new medicines your health care provider can prescribe. Stronger medicine may be used if initial measures are ineffective. Desensitizing injections can be used if medicine and avoidance does not work. Desensitization is when a patient is given ongoing shots until the body becomes less sensitive to the allergen. Make sure you follow up with your health care provider if problems continue. HOME CARE INSTRUCTIONS It is not possible to completely avoid allergens, but you can reduce your symptoms by taking steps to limit your exposure to them. It helps to know exactly what you are allergic to so that you can avoid your specific triggers. SEEK MEDICAL CARE IF:  You have a fever.  You develop a cough that does not stop easily (persistent).  You have shortness of breath.  You start wheezing.  Symptoms interfere with normal daily activities.   This information is not intended to replace advice given to you by your health care provider. Make sure you discuss any questions you have with your health care provider.   Document Released: 06/02/2001 Document Revised: 09/28/2014 Document Reviewed: 05/15/2013 Elsevier Interactive Patient Education 2016 Elsevier Inc.  Cough, Adult A cough helps to clear your throat and lungs. A cough may last only 2-3 weeks (acute), or it may last longer than 8 weeks (chronic). Many different things can  cause a cough. A cough may be a sign of an illness or another medical condition. HOME CARE  Pay attention to any changes in your cough.  Take medicines only as told by your  doctor.  If you were prescribed an antibiotic medicine, take it as told by your doctor. Do not stop taking it even if you start to feel better.  Talk with your doctor before you try using a cough medicine.  Drink enough fluid to keep your pee (urine) clear or pale yellow.  If the air is dry, use a cold steam vaporizer or humidifier in your home.  Stay away from things that make you cough at work or at home.  If your cough is worse at night, try using extra pillows to raise your head up higher while you sleep.  Do not smoke, and try not to be around smoke. If you need help quitting, ask your doctor.  Do not have caffeine.  Do not drink alcohol.  Rest as needed. GET HELP IF:  You have new problems (symptoms).  You cough up yellow fluid (pus).  Your cough does not get better after 2-3 weeks, or your cough gets worse.  Medicine does not help your cough and you are not sleeping well.  You have pain that gets worse or pain that is not helped with medicine.  You have a fever.  You are losing weight and you do not know why.  You have night sweats. GET HELP RIGHT AWAY IF:  You cough up blood.  You have trouble breathing.  Your heartbeat is very fast.   This information is not intended to replace advice given to you by your health care provider. Make sure you discuss any questions you have with your health care provider.   Document Released: 05/21/2011 Document Revised: 05/29/2015 Document Reviewed: 11/14/2014 Elsevier Interactive Patient Education 2016 [employer].  [employer] Barotitis media is inflammation of your middle ear. This occurs when the auditory tube (eustachian tube) leading from the back of your nose (nasopharynx) to your eardrum is blocked. This blockage may result from a cold, environmental allergies, or an upper respiratory infection. Unresolved barotitis media may lead to damage or hearing loss (barotrauma), which may become permanent. HOME CARE  INSTRUCTIONS   Use medicines as recommended by your health care provider. Over-the-counter medicines will help unblock the canal and can help during times of air travel.  Do not put anything into your ears to clean or unplug them. Eardrops will not be helpful.  Do not swim, dive, or fly until your health care provider says it is all right to do so. If these activities are necessary, chewing gum with frequent, forceful swallowing may help. It is also helpful to hold your nose and gently blow to pop your ears for equalizing pressure changes. This forces air into the eustachian tube.  Only take over-the-counter or prescription medicines for pain, discomfort, or fever as directed by your health care provider.  A decongestant may be helpful in decongesting the middle ear and make pressure equalization easier. SEEK MEDICAL CARE IF:  You experience a serious form of dizziness in which you feel as if the room is spinning and you feel nauseated (vertigo).  Your symptoms only involve one ear. SEEK IMMEDIATE MEDICAL CARE IF:   You develop a severe headache, dizziness, or severe ear pain.  You have bloody or pus-like drainage from your ears.  You develop a fever.  Your problems do not improve or become  worse. MAKE SURE YOU:   Understand these instructions.  Will watch your condition.  Will get help right away if you are not doing well or get worse.   This information is not intended to replace advice given to you by your health care provider. Make sure you discuss any questions you have with your health care provider.   Document Released: 09/04/2000 Document Revised: 06/28/2013 Document Reviewed: 04/04/2013 Elsevier Interactive Patient Education [employer]

## 2016-03-08 NOTE — ED Notes (Signed)
[facility] a [facility] sore throat, bilateral ear pain and a cough x 3 days. The patient denied running a fever.

## 2016-09-16 ENCOUNTER — Encounter (HOSPITAL_COMMUNITY): Payer: Self-pay | Admitting: Nurse Practitioner

## 2016-09-16 ENCOUNTER — Emergency Department (HOSPITAL_COMMUNITY)
Admission: EM | Admit: 2016-09-16 | Discharge: 2016-09-16 | Disposition: A | Discharge status: Home / Self Care | Payer: Self-pay | Attending: Emergency Medicine | Admitting: Emergency Medicine

## 2016-09-16 DIAGNOSIS — H6591 Unspecified nonsuppurative otitis media, right ear: Secondary | ICD-10-CM

## 2016-09-16 DIAGNOSIS — Z79899 Other long term (current) drug therapy: Secondary | ICD-10-CM | POA: Insufficient documentation

## 2016-09-16 DIAGNOSIS — H61891 Other specified disorders of right external ear: Secondary | ICD-10-CM | POA: Insufficient documentation

## 2016-09-16 MED ORDER — AMOXICILLIN 500 MG PO CAPS: 500.0000 mg | ORAL_CAPSULE | Freq: Three times a day (TID) | ORAL | 0 refills | Status: DC

## 2016-09-16 NOTE — ED Provider Notes (Signed)
[facility] Provider Note    By signing my name below, I, Earmon PhoenixJennifer Waddell, attest that this documentation has been prepared under the direction and in the presence of Sharilyn SitesLisa Westyn Driggers, PA-C. Electronically Signed: Earmon PhoenixJennifer Waddell, ED Scribe. 09/16/16. 12:45 PM.    History   Chief Complaint Chief Complaint  Patient presents with  . Otalgia    The history is provided by the patient and medical records. No language interpreter was used.    HPI Comments:  Kaylee Gibson is a 30 y.o. female who presents to the Emergency Department complaining of right ear pain that began about three days ago. She states she feels a popping sensation and then pain in her right ear only. She has not taken anything for pain. She denies modifying factors. She denies fever, chills, nausea, vomiting or drainage from the ear.   No past medical history on file.  Patient Active Problem List   Diagnosis Date Noted  . Atopic dermatitis 01/16/2016  . Preventative health care 01/16/2016    No past surgical history on file.  OB History    No data available       Home Medications    Prior to Admission medications   Medication Sig Start Date End Date Taking? Authorizing Provider  Cetirizine HCl 10 MG CAPS Take 1 capsule (10 mg total) by mouth every morning. 01/16/16   Denton Brickiana M Truong, MD  hydrocortisone cream 0.5 % Apply 1 application topically 3 (three) times daily. 01/16/16   Denton Brickiana M Truong, MD  hydrOXYzine (ATARAX/VISTARIL) 10 MG tablet Take 1 tablet (10 mg total) by mouth 3 (three) times daily as needed. 01/16/16   Denton Brickiana M Truong, MD  predniSONE (DELTASONE) 20 MG tablet Take 2 tabs po on first day, 2 tabs second day, 2 tabs third day, 1 tab fourth day, 1 tab 5th day. Take with food. 03/08/16   Hayden Rasmussenavid Mabe, NP    Family History No family history on file.  Social History Social History  Substance Use Topics  . Smoking status: Never Smoker  . Smokeless tobacco: Not on file  . Alcohol use No      Allergies   Patient has no known allergies.   Review of Systems Review of Systems  Constitutional: Negative for chills and fever.  HENT: Positive for ear pain. Negative for ear discharge.   Gastrointestinal: Negative for nausea and vomiting.  All other systems reviewed and are negative.    Physical Exam Updated Vital Signs BP 119/77 (BP Location: Left Arm)   Pulse 89   Temp 97.8 F (36.6 C) (Oral)   Resp 20   SpO2 100%   Physical Exam  Constitutional: She is oriented to person, place, and time. She appears well-developed and well-nourished.  HENT:  Head: Normocephalic and atraumatic.  Right Ear: A middle ear effusion is present.  Left Ear: Tympanic membrane normal.  Mouth/Throat: Uvula is midline, oropharynx is clear and moist and mucous membranes are normal.  Right middle ear effusion, slight erythema of the canal,TM appears intact without signs of perforation, mastoid non-tender Left ear normal  Eyes: Conjunctivae and EOM are normal. Pupils are equal, round, and reactive to light.  Neck: Normal range of motion.  Cardiovascular: Normal rate, regular rhythm and normal heart sounds.   Pulmonary/Chest: Effort normal and breath sounds normal.  Abdominal: Soft. Bowel sounds are normal.  Musculoskeletal: Normal range of motion.  Neurological: She is alert and oriented to person, place, and time.  Skin: Skin is warm and dry.  Psychiatric: She has a normal mood and affect.  Nursing note and vitals reviewed.    ED Treatments / Results  DIAGNOSTIC STUDIES: Oxygen Saturation is 100% on RA, normal by my interpretation.   COORDINATION OF CARE: 12:38 PM- Will prescribe Amoxicillin. Pt verbalizes understanding and agrees to plan.  Medications - No data to display  Labs (all labs ordered are listed, but only abnormal results are displayed) Labs Reviewed - No data to display  EKG  EKG Interpretation None       Radiology No results  found.  Procedures Procedures (including critical care time)  Medications Ordered in ED Medications - No data to display   Initial Impression / Assessment and Plan / ED Course  I have reviewed the triage vital signs and the nursing notes.  Pertinent labs & imaging results that were available during my care of the patient were reviewed by me and considered in my medical decision making (see chart for details).  Clinical Course    30 y.o. F here with right ear pain.  She is afebrile, non-toxic.  On exam she has right middle ear effusion without signs of perforation.  Slight erythema of the canal.  Mastoid non-tender.  Remainder of exam is benign.  Will start on amoxicillin.  Encouraged to follow-up with PCP if not improving in the next few days.  Discussed plan with patient, he/she acknowledged understanding and agreed with plan of care.  Return precautions given for new or worsening symptoms.  Final Clinical Impressions(s) / ED Diagnoses   Final diagnoses:  Middle ear effusion, right    New Prescriptions Discharge Medication List as of 09/16/2016 12:49 PM    START taking these medications   Details  amoxicillin (AMOXIL) 500 MG capsule Take 1 capsule (500 mg total) by mouth 3 (three) times daily., Starting Wed 09/16/2016, Print       I personally performed the services described in this documentation, which was scribed in my presence. The recorded information has been reviewed and is accurate.   Garlon HatchetLisa M Lacrystal Barbe, PA-C 09/16/16 1418    Rolland PorterMark James, MD 09/24/16 [phone]

## 2016-09-16 NOTE — Discharge Instructions (Signed)
Take the prescribed medication as directed.  May take tylenol or motrin for pain. Follow-up with your primary care doctor if not improving. Return to the ED for new or worsening symptoms.

## 2016-09-16 NOTE — ED Triage Notes (Signed)
Pt endorses right ear pain x3 days without drainage, trauma or hearing impairment. Patient states pain is better with ibuprofen.

## 2016-10-02 ENCOUNTER — Encounter (HOSPITAL_COMMUNITY): Payer: Self-pay | Admitting: *Deleted

## 2016-10-02 ENCOUNTER — Ambulatory Visit (HOSPITAL_COMMUNITY)
Admission: EM | Admit: 2016-10-02 | Discharge: 2016-10-02 | Disposition: A | Discharge status: Home / Self Care | Payer: Self-pay | Attending: Family Medicine | Admitting: Family Medicine

## 2016-10-02 DIAGNOSIS — H6981 Other specified disorders of Eustachian tube, right ear: Secondary | ICD-10-CM

## 2016-10-02 DIAGNOSIS — Z9189 Other specified personal risk factors, not elsewhere classified: Secondary | ICD-10-CM

## 2016-10-02 MED ORDER — IPRATROPIUM BROMIDE 0.06 % NA SOLN: 2.0000 | Freq: Four times a day (QID) | NASAL | 12 refills | Status: DC

## 2016-10-02 MED ORDER — METHYLPREDNISOLONE ACETATE 80 MG/ML IJ SUSP
INTRAMUSCULAR | Status: AC
  Filled 2016-10-02: qty 1

## 2016-10-02 MED ORDER — METHYLPREDNISOLONE ACETATE 80 MG/ML IJ SUSP
80.0000 mg | Freq: Once | INTRAMUSCULAR | Status: AC
  Administered 2016-10-02: 80 mg via INTRAMUSCULAR

## 2016-10-02 NOTE — ED Triage Notes (Signed)
Was  Seen   Sev  Weeks    For  Ear  Infection  Took  All  meds  Still  Has   Symptoms     Also  Has  A  Rash     That  Started  6  Days  Ago    itches

## 2016-10-02 NOTE — ED Provider Notes (Signed)
[facility]    CSN: [id number] Arrival date & time: 10/02/16  1013     History   Chief Complaint Chief Complaint  Patient presents with  . Otalgia    HPI Kaylee Gibson is a 31 y.o. female.   The history is provided by the patient.  Otalgia  Location:  Right Behind ear:  No abnormality Quality:  Pressure Onset quality:  Gradual Duration:  1 week Progression:  Unchanged Chronicity:  New Context comment:  Seen recently in ER and given amox without relief of sx. Associated symptoms: congestion and rash     History reviewed. No pertinent past medical history.  Patient Active Problem List   Diagnosis Date Noted  . Atopic dermatitis 01/16/2016  . Preventative health care 01/16/2016    History reviewed. No pertinent surgical history.  OB History    No data available       Home Medications    Prior to Admission medications   Medication Sig Start Date End Date Taking? Authorizing Provider  amoxicillin (AMOXIL) 500 MG capsule Take 1 capsule (500 mg total) by mouth 3 (three) times daily. 09/16/16   Garlon Hatchet, PA-C  Cetirizine HCl 10 MG CAPS Take 1 capsule (10 mg total) by mouth every morning. 01/16/16   Denton Brick, MD  hydrocortisone cream 0.5 % Apply 1 application topically 3 (three) times daily. 01/16/16   Denton Brick, MD  hydrOXYzine (ATARAX/VISTARIL) 10 MG tablet Take 1 tablet (10 mg total) by mouth 3 (three) times daily as needed. 01/16/16   Denton Brick, MD  ipratropium (ATROVENT) 0.06 % nasal spray Place 2 sprays into the nose 4 (four) times daily. 10/02/16   Linna Hoff, MD  predniSONE (DELTASONE) 20 MG tablet Take 2 tabs po on first day, 2 tabs second day, 2 tabs third day, 1 tab fourth day, 1 tab 5th day. Take with food. 03/08/16   Hayden Rasmussen, NP    Family History History reviewed. No pertinent family history.  Social History Social History  Substance Use Topics  . Smoking status: Never Smoker  . Smokeless tobacco: Never Used    . Alcohol use No     Allergies   Patient has no known allergies.   Review of Systems Review of Systems  Constitutional: Negative.   HENT: Positive for congestion, ear pain and postnasal drip.   Respiratory: Negative.   Cardiovascular: Negative.   Skin: Positive for rash.     Physical Exam Triage Vital Signs ED Triage Vitals  Enc Vitals Group     BP 10/02/16 1028 103/70     Pulse Rate 10/02/16 1028 93     Resp 10/02/16 1028 12     Temp 10/02/16 1028 99.3 F (37.4 C)     Temp Source 10/02/16 1028 Oral     SpO2 10/02/16 1028 99 %     Weight --      Height --      Head Circumference --      Peak Flow --      Pain Score 10/02/16 1034 0     Pain Loc --      Pain Edu? --      Excl. in GC? --    No data found.   Updated Vital Signs BP 103/70 (BP Location: Left Arm)   Pulse 93   Temp 99.3 F (37.4 C) (Oral)   Resp 12   LMP 09/20/2016   SpO2 99%   Visual Acuity Right  Eye Distance:   Left Eye Distance:   Bilateral Distance:    Right Eye Near:   Left Eye Near:    Bilateral Near:     Physical Exam  Constitutional: She appears well-developed and well-nourished.  HENT:  Right Ear: External ear normal.  Left Ear: External ear normal.  Nose: Nose normal.  Mouth/Throat: Oropharynx is clear and moist.  Eyes: Conjunctivae are normal. Pupils are equal, round, and reactive to light.  Neck: Normal range of motion. Neck supple.  Lymphadenopathy:    She has no cervical adenopathy.  Skin: Rash noted.  Nursing note and vitals reviewed.    UC Treatments / Results  Labs (all labs ordered are listed, but only abnormal results are displayed) Labs Reviewed - No data to display  EKG  EKG Interpretation None       Radiology No results found.  Procedures Procedures (including critical care time)  Medications Ordered in UC Medications  methylPREDNISolone acetate (DEPO-MEDROL) injection 80 mg (80 mg Intramuscular Given 10/02/16 1056)     Initial  Impression / Assessment and Plan / UC Course  I have reviewed the triage vital signs and the nursing notes.  Pertinent labs & imaging results that were available during my care of the patient were reviewed by me and considered in my medical decision making (see chart for details).       Final Clinical Impressions(s) / UC Diagnoses   Final diagnoses:  ETD (Eustachian tube dysfunction), right  At risk for allergic reaction to medication    New Prescriptions Discharge Medication List as of 10/02/2016 10:52 AM    START taking these medications   Details  ipratropium (ATROVENT) 0.06 % nasal spray Place 2 sprays into the nose 4 (four) times daily., Starting Fri 10/02/2016, Normal         Linna HoffJames D Bacilio Abascal, MD 10/20/16 [phone]

## 2016-10-02 NOTE — ED Notes (Signed)
Pt reports     Symptoms       Of  The  Rash   Itches  As   Well

## 2016-10-02 NOTE — Discharge Instructions (Signed)
No more antibiotic , use medicine as prescribed, see your doctor as needed.

## 2016-10-28 ENCOUNTER — Other Ambulatory Visit (HOSPITAL_COMMUNITY)
Admission: RE | Admit: 2016-10-28 | Discharge: 2016-10-28 | Disposition: A | Discharge status: Home / Self Care | Payer: Self-pay | Source: Ambulatory Visit | Attending: Internal Medicine | Admitting: Internal Medicine

## 2016-10-28 ENCOUNTER — Encounter: Payer: Self-pay | Admitting: Internal Medicine

## 2016-10-28 ENCOUNTER — Ambulatory Visit (INDEPENDENT_AMBULATORY_CARE_PROVIDER_SITE_OTHER): Payer: Self-pay | Admitting: Internal Medicine

## 2016-10-28 ENCOUNTER — Encounter (INDEPENDENT_AMBULATORY_CARE_PROVIDER_SITE_OTHER): Payer: Self-pay

## 2016-10-28 VITALS — BP 113/72 | HR 92 | Temp 98.0°F | Wt 102.8 lb

## 2016-10-28 DIAGNOSIS — R21 Rash and other nonspecific skin eruption: Secondary | ICD-10-CM

## 2016-10-28 DIAGNOSIS — Z91048 Other nonmedicinal substance allergy status: Secondary | ICD-10-CM

## 2016-10-28 DIAGNOSIS — L209 Atopic dermatitis, unspecified: Secondary | ICD-10-CM

## 2016-10-28 DIAGNOSIS — L308 Other specified dermatitis: Secondary | ICD-10-CM | POA: Insufficient documentation

## 2016-10-28 DIAGNOSIS — Z840 Family history of diseases of the skin and subcutaneous tissue: Secondary | ICD-10-CM

## 2016-10-28 NOTE — Progress Notes (Signed)
   CC: rash   HPI: [name] is a 31 y.o. with past medical history as outlined below who presents to clinic for follow up of rash.   Three weeks ago she developed a rash after taking amoxicillin for an ear infection. The rash started over her stomach and back at first, then spread to her chest and extremities. It starts out as a small red itchy area then becomes a brown plaque. Rash is associated with a "poking" pain. She went to the ED and urgent care and was told that she might be having an allergic reaction. She was told to take benadryl and use hydrocortisone cream, she feels that these have helped to relieve this discomfort partially. She has a history of year round environmental allergies. Many family members have eczema. She has had no sick contacts. She is not sexually active. She has not tried any new foods or body wash. She has recently started using a new laundry detergent but this was after the rash began.   Please see problem list for status of the pt's chronic medical problems.  No past medical history on file.  Review of Systems:  Please see each problem below for a pertinent review of systems.  Physical Exam:  Vitals:   10/28/16 0955  BP: 113/72  Pulse: 92  Temp: 98 F (36.7 C)  TempSrc: Oral  SpO2: 100%  Weight: 102 lb 12.8 oz (46.6 kg)   Physical Exam  Constitutional: She appears well-developed and well-nourished. No distress.  HENT:  Head: Normocephalic and atraumatic.  Eyes: Conjunctivae are normal. No scleral icterus.  Skin: Skin is warm and dry. Rash noted. She is not diaphoretic.  Plaque like shiny raised rash with erythematous base on the back, hair line, cheeks, chest, abdomen, behind the knees, ventral arms, and axilla. Few new small erythematous lesions on both wrist       Assessment & Plan:   See Encounters Tab for problem based charting.  Rash  Performed lesional punch biopsy of the plaque over her left upper back, plan will be based on  the results of this pathology. Presentation of the rash seems more consistent with psoriasis.   -continue hydrocortisone cream and benadryl  -follow up pathology results   Patient seen with Dr. Heide Gibson

## 2016-10-30 DIAGNOSIS — L309 Dermatitis, unspecified: Secondary | ICD-10-CM | POA: Insufficient documentation

## 2016-10-30 NOTE — Patient Instructions (Signed)
It was a pleasure to meet you today Ms. Kaylee Gibson,   For your rash, someone will call you with the results that we get from this biopsy, If you havent heard from the clinic in 2 weeks give us a call

## 2016-10-30 NOTE — Assessment & Plan Note (Addendum)
Presentation of the rash seems more consistent with psoriasis vs eczema. Performed lesional punch biopsy of the plaque over her left upper back, plan will be based on the results of this pathology. -continue hydrocortisone cream and benadryl  -follow up pathology results   Addendum:  Pathology results were spongiotic dermatitis/ eczema. I called to communicate these results to the patient.  -rx for hydrocortisone 2.5% topical cream to be be applied 1-2 times per day  - pt will contact clinic in 2 weeks if this has not worked to relieve her rash

## 2016-11-03 ENCOUNTER — Telehealth: Payer: Self-pay

## 2016-11-03 NOTE — Telephone Encounter (Signed)
Requesting lab result. Please call back. 

## 2016-11-04 NOTE — Progress Notes (Signed)
Internal Medicine Clinic Attending  I saw and evaluated the patient.  I personally confirmed the key portions of the history and exam documented by Dr. Obie DredgeBlum and I reviewed pertinent patient test results.  The assessment, diagnosis, and plan were formulated together and I agree with the documentation in the resident's note.  Path is consistent with dermatitis. Agree with plan for now. Patient to follow up with derm

## 2016-11-09 MED ORDER — HYDROCORTISONE 2.5 % EX LOTN: TOPICAL_LOTION | CUTANEOUS | 0 refills | Status: DC

## 2016-11-09 NOTE — Telephone Encounter (Signed)
Called pt to communicate results of skin biopsy ( see addendum from last office visit )

## 2016-11-09 NOTE — Telephone Encounter (Signed)
Patient call again to request her lab results.

## 2016-11-09 NOTE — Addendum Note (Signed)
Addended by: Earl LagosBLUM, Bartolo Montanye S on: 11/09/2016 01:40 PM   Modules accepted: Orders

## 2017-06-29 ENCOUNTER — Encounter: Payer: Self-pay | Admitting: Internal Medicine

## 2017-07-20 ENCOUNTER — Encounter: Payer: Self-pay | Admitting: Internal Medicine

## 2017-07-20 NOTE — Progress Notes (Deleted)
[facility]  HIV screening  Pap smear today  Flu or Tdap today   Rash  Pathology = spongiotic dermatitis and referral to derm   Atopic dermatitis

## 2017-11-01 ENCOUNTER — Ambulatory Visit (INDEPENDENT_AMBULATORY_CARE_PROVIDER_SITE_OTHER): Payer: Self-pay | Admitting: Internal Medicine

## 2017-11-01 ENCOUNTER — Other Ambulatory Visit: Payer: Self-pay

## 2017-11-01 ENCOUNTER — Encounter: Payer: Self-pay | Admitting: Internal Medicine

## 2017-11-01 ENCOUNTER — Encounter (INDEPENDENT_AMBULATORY_CARE_PROVIDER_SITE_OTHER): Payer: Self-pay

## 2017-11-01 VITALS — BP 110/73 | HR 99 | Temp 97.8°F | Ht 61.0 in | Wt 103.5 lb

## 2017-11-01 DIAGNOSIS — L309 Dermatitis, unspecified: Secondary | ICD-10-CM

## 2017-11-01 DIAGNOSIS — Z114 Encounter for screening for human immunodeficiency virus [HIV]: Secondary | ICD-10-CM

## 2017-11-01 DIAGNOSIS — Z113 Encounter for screening for infections with a predominantly sexual mode of transmission: Secondary | ICD-10-CM

## 2017-11-01 DIAGNOSIS — Z124 Encounter for screening for malignant neoplasm of cervix: Secondary | ICD-10-CM

## 2017-11-01 DIAGNOSIS — H509 Unspecified strabismus: Secondary | ICD-10-CM

## 2017-11-01 DIAGNOSIS — Z23 Encounter for immunization: Secondary | ICD-10-CM

## 2017-11-01 NOTE — Progress Notes (Deleted)
   CC: ***  HPI:  Ms.Michaelle Judie PetitM Jimmey Ralpharker is a 32 y.o. with PMH eczema who presents for follow up/ acute concern of ***. Please see the assessment and plans for the status of the patient chronic medical problems.    No past medical history on file.  Review of Systems:  Refer to history of present illness and assessment and plans for pertinent review of systems, all others reviewed and negative   Physical Exam:  There were no vitals filed for this visit. ***  Assessment & Plan:   Jimmey RalphParker  HIV screening  Pap smear today  Flu or Tdap today   Rash  Pathology = spongiotic dermatitis and referral to derm   Atopic dermatitis    See Encounters Tab for problem based charting.  Patient {GC/GE:3044014::"discussed with","seen with"} Dr. {NAMES:[id number]::"Butcher","Granfortuna","E. Hoffman","Klima","Mullen","Narendra","Raines","Vincent"}

## 2017-11-01 NOTE — Patient Instructions (Addendum)
It was a pleasure to see you today Kaylee Gibson,   Keep up the great work on your studies and health. Today we gave you a flu shot. Please schedule a follow up appointment in 1 year   - Please call our clinic if you have any problems or questions, we may be able to help you and keep you from a long emergency room wait. Our clinic and after hours phone number is [phone]

## 2017-11-01 NOTE — Progress Notes (Signed)
   CC: here for screening of sexually transmitted infections   HPI:  [name] is a 32 y.o. with PMH as listed below who presents for preventative health and screening of sexually transmitted infections. Please see the assessment and plans for the status of the patient chronic medical problems.    Past Medical History:  Diagnosis Date  . Eczema    Review of Systems:  Refer to history of present illness and assessment and plans for pertinent review of systems, all others reviewed and negative  Physical Exam:  Vitals:   11/01/17 1505  BP: 110/73  Pulse: 99  Temp: 97.8 F (36.6 C)  TempSrc: Oral  SpO2: 100%  Weight: 103 lb 8 oz (46.9 kg)  Height: 5\' 1"  (1.549 m)   General: well appearing, no acute distress HEENT: strabismus Cardiac: RRR, no murmur appreciated, no peripheral edema   Pulm: lungs clear to auscultation, no respiratory distress  Skin: erythematous macules over bilateral cheeks   Assessment & Plan:   Eczema  Reports this is relatively well controlled with emollients and that she is no longer using hydrocortisone. Some eczema is visible on the cheeks today.  - continue emollient therapy   Screening for cervical cancer and STIs Here for screening for STIs. She is not sexually active at this time and has not had vaginal intercourse in the past she does not believe that she has been exposed to STIs but says that she wants to be safe and know for sure. She could not tolerate the speculum for pap smear collection but there were no lesions of the vaginal canal or labia.  - Reassurance on the low likelihood of STIs and cervical cancer without vaginal intercourse, encouraged to return if she feels her risk have changed at any point in the future  - HIV today neg  Preventative health  - provided influenza vaccine today   See Encounters Tab for problem based charting.  Patient discussed with Dr. Rogelia BogaButcher

## 2017-11-02 ENCOUNTER — Encounter: Payer: Self-pay | Admitting: Internal Medicine

## 2017-11-02 DIAGNOSIS — Z124 Encounter for screening for malignant neoplasm of cervix: Secondary | ICD-10-CM | POA: Insufficient documentation

## 2017-11-02 LAB — HIV ANTIBODY (ROUTINE TESTING W REFLEX): HIV SCREEN 4TH GENERATION: NONREACTIVE

## 2017-11-02 NOTE — Assessment & Plan Note (Signed)
Reports this is relatively well controlled with emollients and that she is no longer using hydrocortisone. Some eczema is visible on the cheeks today.  - continue emollient therapy

## 2017-11-02 NOTE — Progress Notes (Signed)
Internal Medicine Clinic Attending  Case discussed with Dr. Blum at the time of the visit.  We reviewed the resident's history and exam and pertinent patient test results.  I agree with the assessment, diagnosis, and plan of care documented in the resident's note. 

## 2017-11-02 NOTE — Assessment & Plan Note (Addendum)
Here for screening for STIs. She is not sexually active at this time and has not had vaginal intercourse in the past she does not believe that she has been exposed to STIs but says that she wants to be safe and know for sure. She could not tolerate the speculum for pap smear collection but there were no lesions of the vaginal canal or labia.  - Reassurance on the low likelihood of STIs and cervical cancer without vaginal intercourse, encouraged to return if she feels her risk have changed at any point in the future  - HIV today neg

## 2017-11-25 ENCOUNTER — Encounter (HOSPITAL_COMMUNITY): Payer: Self-pay | Admitting: Emergency Medicine

## 2017-11-25 ENCOUNTER — Other Ambulatory Visit: Payer: Self-pay

## 2017-11-25 ENCOUNTER — Ambulatory Visit (HOSPITAL_COMMUNITY)
Admission: EM | Admit: 2017-11-25 | Discharge: 2017-11-25 | Disposition: A | Discharge status: Home / Self Care | Payer: Self-pay | Attending: Family Medicine | Admitting: Family Medicine

## 2017-11-25 DIAGNOSIS — J111 Influenza due to unidentified influenza virus with other respiratory manifestations: Secondary | ICD-10-CM

## 2017-11-25 DIAGNOSIS — R69 Illness, unspecified: Secondary | ICD-10-CM

## 2017-11-25 MED ORDER — HYDROCODONE-HOMATROPINE 5-1.5 MG/5ML PO SYRP: 5.0000 mL | ORAL_SOLUTION | Freq: Four times a day (QID) | ORAL | 0 refills | Status: DC | PRN

## 2017-11-25 MED ORDER — MELOXICAM 15 MG PO TABS: 15.0000 mg | ORAL_TABLET | Freq: Every day | ORAL | 1 refills | Status: DC

## 2017-11-25 NOTE — ED Provider Notes (Signed)
  [facility]   [id number] 11/25/17 Arrival Time: 1604   SUBJECTIVE:  Kaylee Gibson is a 32 y.o. female who presents to the urgent care with complaint of body aches, coughing, throat pain, ear pain bilateral. C/o symptoms x1 week.   No GI sx  Unemployed.  Past Medical History:  Diagnosis Date  . Eczema    Family History  Problem Relation Age of Onset  . Asthma Mother   . Asthma Father   . Brain cancer Brother        Neuroblastoma   . Diabetes Maternal Aunt    Social History   Socioeconomic History  . Marital status: Single    Spouse name: Not on file  . Number of children: Not on file  . Years of education: Not on file  . Highest education level: Not on file  Social Needs  . Financial resource strain: Not on file  . Food insecurity - worry: Not on file  . Food insecurity - inability: Not on file  . Transportation needs - medical: Not on file  . Transportation needs - non-medical: Not on file  Occupational History  . Not on file  Tobacco Use  . Smoking status: Never Smoker  . Smokeless tobacco: Never Used  Substance and Sexual Activity  . Alcohol use: No    Alcohol/week: 0.0 oz  . Drug use: No  . Sexual activity: Not on file  Other Topics Concern  . Not on file  Social History Narrative  . Not on file   No outpatient medications have been marked as taking for the 11/25/17 encounter [facility](Hospital Encounter).   Allergies  Allergen Reactions  . Amoxicillin       ROS: As per HPI, remainder of ROS negative.   OBJECTIVE:   Vitals:   11/25/17 1651  BP: 121/85  Pulse: 89  Resp: 14  Temp: 98.2 F (36.8 C)  SpO2: 100%     General appearance: alert; no distress Eyes: PERRL; EOMI; conjunctiva normal HENT: normocephalic; atraumatic; TMs normal, canal normal, external ears normal without trauma; nasal mucosa normal; oral mucosa normal Neck: supple Lungs: clear to auscultation bilaterally Heart: regular rate and rhythm Back: no CVA  tenderness Extremities: no cyanosis or edema; symmetrical with no gross deformities Skin: warm and dry Neurologic: normal gait; grossly normal Psychological: alert and cooperative; normal mood and affect      Labs:  Results for orders placed or performed in visit on 11/01/17  HIV antibody (with reflex)  Result Value Ref Range   HIV Screen 4th Generation wRfx Non Reactive Non Reactive    Labs Reviewed - No data to display  No results found.     ASSESSMENT & PLAN:  1. Influenza-like illness     Meds ordered this encounter  Medications  . HYDROcodone-homatropine (HYDROMET) 5-1.5 MG/5ML syrup    Sig: Take 5 mLs by mouth every 6 (six) hours as needed for cough.    Dispense:  60 mL    Refill:  0  . meloxicam (MOBIC) 15 MG tablet    Sig: Take 1 tablet (15 mg total) by mouth daily.    Dispense:  7 tablet    Refill:  1    Reviewed expectations re: course of current medical issues. Questions answered. Outlined signs and symptoms indicating need for more acute intervention. Patient verbalized understanding. After Visit Summary given.    Procedures:      Elvina SidleLauenstein, Gloristine Turrubiates, MD 11/25/17 [id number]

## 2017-11-25 NOTE — ED Triage Notes (Signed)
Pt c/o body aches, coughing, throat pain, ear pain bilateral. C/o symptoms x1 week.

## 2017-11-25 NOTE — Discharge Instructions (Signed)
Call "[employer]" for job options.

## 2017-12-06 ENCOUNTER — Other Ambulatory Visit: Payer: Self-pay

## 2017-12-06 ENCOUNTER — Emergency Department (HOSPITAL_COMMUNITY)
Admission: EM | Admit: 2017-12-06 | Discharge: 2017-12-06 | Disposition: A | Discharge status: Home / Self Care | Payer: Self-pay | Attending: Emergency Medicine | Admitting: Emergency Medicine

## 2017-12-06 ENCOUNTER — Encounter (HOSPITAL_COMMUNITY): Payer: Self-pay

## 2017-12-06 DIAGNOSIS — M791 Myalgia, unspecified site: Secondary | ICD-10-CM | POA: Insufficient documentation

## 2017-12-06 DIAGNOSIS — M7918 Myalgia, other site: Secondary | ICD-10-CM

## 2017-12-06 DIAGNOSIS — Z79899 Other long term (current) drug therapy: Secondary | ICD-10-CM | POA: Insufficient documentation

## 2017-12-06 MED ORDER — IBUPROFEN 600 MG PO TABS: 600.0000 mg | ORAL_TABLET | Freq: Four times a day (QID) | ORAL | 0 refills | Status: DC | PRN

## 2017-12-06 MED ORDER — IBUPROFEN 400 MG PO TABS
600.0000 mg | ORAL_TABLET | Freq: Once | ORAL | Status: AC
  Administered 2017-12-06: 600 mg via ORAL
  Filled 2017-12-06: qty 1

## 2017-12-06 NOTE — ED Notes (Signed)
Patient reports R-leg pain since last night(9 on a 0-10 scale) L-arm pain since this morning 9 on a 0-10 scale. Took Goody powder last night.

## 2017-12-06 NOTE — ED Provider Notes (Signed)
[facility] EMERGENCY DEPARTMENT Provider Note   CSN: [id number] Arrival date & time: 12/06/17  1119     History   Chief Complaint Chief Complaint  Patient presents with  . Hand Pain  . Hip Pain    HPI Kaylee Gibson is a 32 y.o. female.  HPI  32 year old female presents today with multiple complaints.  Patient reports yesterday she started to develop right lateral hip pain no swelling or edema, no trauma, no distal neurological deficits worse with ambulation palpation.  No history of the same.  Patient denies abdominal pain.  Patient also reports waking up with left wrist and hand pain.  Worse with pressure at the proximal wrist, no neurological deficits, some tenderness to the left lateral shoulder again no fevers or infectious signs reported.          Past Medical History:  Diagnosis Date  . Eczema     Patient Active Problem List   Diagnosis Date Noted  . Encounter for screening for cervical cancer  11/02/2017  . Eczema 10/30/2016  . Preventative health care 01/16/2016    History reviewed. No pertinent surgical history.  OB History    No data available       Home Medications    Prior to Admission medications   Medication Sig Start Date End Date Taking? Authorizing Provider  HYDROcodone-homatropine (HYDROMET) 5-1.5 MG/5ML syrup Take 5 mLs by mouth every 6 (six) hours as needed for cough. 11/25/17   Elvina Sidle, MD  ibuprofen (ADVIL,MOTRIN) 600 MG tablet Take 1 tablet (600 mg total) by mouth every 6 (six) hours as needed. 12/06/17   Clotine Heiner, Tinnie Gens, PA-C  meloxicam (MOBIC) 15 MG tablet Take 1 tablet (15 mg total) by mouth daily. 11/25/17   Elvina Sidle, MD    Family History Family History  Problem Relation Age of Onset  . Asthma Mother   . Asthma Father   . Brain cancer Brother        Neuroblastoma   . Diabetes Maternal Aunt     Social History Social History   Tobacco Use  . Smoking status: Never Smoker  . Smokeless  tobacco: Never Used  Substance Use Topics  . Alcohol use: No    Alcohol/week: 0.0 oz  . Drug use: No     Allergies   Amoxicillin   Review of Systems Review of Systems  All other systems reviewed and are negative.    Physical Exam Updated Vital Signs BP 116/80 (BP Location: Right Arm)   Pulse 96   Temp 98 F (36.7 C) (Oral)   Resp 16   Ht 5\' 1"  (1.549 m)   Wt 46.7 kg (103 lb)   LMP 11/25/2017 (Exact Date)   SpO2 99%   BMI 19.46 kg/m   Physical Exam  Constitutional: She is oriented to person, place, and time. She appears well-developed and well-nourished.  HENT:  Head: Normocephalic and atraumatic.  Eyes: Conjunctivae are normal. Pupils are equal, round, and reactive to light. Right eye exhibits no discharge. Left eye exhibits no discharge. No scleral icterus.  Neck: Normal range of motion. No JVD present. No tracheal deviation present.  Pulmonary/Chest: Effort normal. No stridor.  Musculoskeletal:  No CT or L-spine tenderness palpation, tenderness palpation of upper gluteus and lateral hip, full active range of motion straight leg negative distal sensation strength motor function intact  There is palpation of the carpal tunnel left, no neurological deficits, no swelling or edema, no redness full active range of motion  of the upper extremity  Neurological: She is alert and oriented to person, place, and time. Coordination normal.  Psychiatric: She has a normal mood and affect. Her behavior is normal. Judgment and thought content normal.  Nursing note and vitals reviewed.   ED Treatments / Results  Labs (all labs ordered are listed, but only abnormal results are displayed) Labs Reviewed - No data to display  EKG  EKG Interpretation None       Radiology No results found.  Procedures Procedures (including critical care time)  Medications Ordered in ED Medications - No data to display   Initial Impression / Assessment and Plan / ED Course  I have  reviewed the triage vital signs and the nursing notes.  Pertinent labs & imaging results that were available during my care of the patient were reviewed by me and considered in my medical decision making (see chart for details).      Final Clinical Impressions(s) / ED Diagnoses   Final diagnoses:  Musculoskeletal pain    32 year old female presents today with numerous complaints.  Likely musculoskeletal in nature, no infectious etiology, no significant neurological deficits.  With wrist brace placed, outpatient follow-up recommended return precautions given.  Patient verbalized understanding and agreement to today's plan.    ED Discharge Orders        Ordered    ibuprofen (ADVIL,MOTRIN) 600 MG tablet  Every 6 hours PRN     12/06/17 1242       Eyvonne MechanicHedges, Brenlyn Beshara, PA-C 12/06/17 1243    Mancel BaleWentz, Elliott, MD 12/08/17 [phone]

## 2017-12-06 NOTE — Discharge Instructions (Signed)
Please read attached information. If you experience any new or worsening signs or symptoms please return to the emergency room for evaluation. Please follow-up with your primary care provider or specialist as discussed. Please use medication prescribed only as directed and discontinue taking if you have any concerning signs or symptoms.   °

## 2017-12-06 NOTE — ED Triage Notes (Signed)
Pt endores left hand pain radiating up the left forearm that began this morning upon waking. Also endorses right hip pain that began yesterday. VSS. Denies cardiac sx.

## 2017-12-06 NOTE — ED Notes (Signed)
D/c reviewed with teach back 

## 2018-07-02 ENCOUNTER — Emergency Department (HOSPITAL_COMMUNITY): Payer: Self-pay

## 2018-07-02 ENCOUNTER — Emergency Department (HOSPITAL_COMMUNITY)
Admission: EM | Admit: 2018-07-02 | Discharge: 2018-07-02 | Disposition: A | Discharge status: Home / Self Care | Payer: Self-pay | Attending: Emergency Medicine | Admitting: Emergency Medicine

## 2018-07-02 ENCOUNTER — Encounter (HOSPITAL_COMMUNITY): Payer: Self-pay | Admitting: Emergency Medicine

## 2018-07-02 DIAGNOSIS — Y939 Activity, unspecified: Secondary | ICD-10-CM | POA: Insufficient documentation

## 2018-07-02 DIAGNOSIS — Y929 Unspecified place or not applicable: Secondary | ICD-10-CM | POA: Insufficient documentation

## 2018-07-02 DIAGNOSIS — Z79899 Other long term (current) drug therapy: Secondary | ICD-10-CM | POA: Insufficient documentation

## 2018-07-02 DIAGNOSIS — M546 Pain in thoracic spine: Secondary | ICD-10-CM

## 2018-07-02 DIAGNOSIS — Y999 Unspecified external cause status: Secondary | ICD-10-CM | POA: Insufficient documentation

## 2018-07-02 DIAGNOSIS — Y33XXXA Other specified events, undetermined intent, initial encounter: Secondary | ICD-10-CM | POA: Insufficient documentation

## 2018-07-02 DIAGNOSIS — S29012A Strain of muscle and tendon of back wall of thorax, initial encounter: Secondary | ICD-10-CM | POA: Insufficient documentation

## 2018-07-02 DIAGNOSIS — T148XXA Other injury of unspecified body region, initial encounter: Secondary | ICD-10-CM

## 2018-07-02 DIAGNOSIS — R05 Cough: Secondary | ICD-10-CM | POA: Insufficient documentation

## 2018-07-02 MED ORDER — IBUPROFEN 800 MG PO TABS: 800.0000 mg | ORAL_TABLET | Freq: Three times a day (TID) | ORAL | 0 refills | Status: DC

## 2018-07-02 MED ORDER — IBUPROFEN 800 MG PO TABS
800.0000 mg | ORAL_TABLET | Freq: Once | ORAL | Status: AC
  Administered 2018-07-02: 800 mg via ORAL
  Filled 2018-07-02: qty 1

## 2018-07-02 NOTE — ED Triage Notes (Signed)
Pt states she has had left upper back pain for 4 days, pain is worse with ROM performed in triage.

## 2018-07-02 NOTE — Discharge Instructions (Addendum)
Take ibuprofen 3 times a day with meals.  Do not take other anti-inflammatories at the same time (Advil, Motrin, naproxen, Aleve). You may supplement with Tylenol if you need further pain control. Use muscle creams (bengay, icy hot, salonpas) as needed for pain.  Return to the ER if you develop high fevers, difficulty breathing, or any new or concerning symptoms.

## 2018-07-02 NOTE — ED Provider Notes (Signed)
[facility] EMERGENCY DEPARTMENT Provider Note   CSN: [id number] Arrival date & time: 07/02/18  1415     History   Chief Complaint Chief Complaint  Patient presents with  . Back Pain    HPI Kaylee Gibson is a 32 y.o. female resenting for evaluation of back pain.  Patient states the past 4 days, she has been having left-sided thoracic back pain.  This is worse with movement and deep breaths.  She is also been coughing and had nasal congestion since then.  Pain began when she woke up one morning.  She denies fall, trauma, or injury.  She denies fevers, chills, anterior chest pain, nausea, vomiting, abdominal pain.  She has no medical problems, takes medications daily.  She has not taken anything for this pain including Tylenol or ibuprofen.  Nothing makes it better.  There is no radiation of her pain.  She denies history of similar.  She is currently not working, but will start a job on Monday.  She denies recent increase in activity, lifting, or physical movement.  She denies recent fevers, chills, rash, loss of bowel bladder control, history of cancer, or history of IV drug use.  HPI  Past Medical History:  Diagnosis Date  . Eczema     Patient Active Problem List   Diagnosis Date Noted  . Encounter for screening for cervical cancer  11/02/2017  . Eczema 10/30/2016  . Preventative health care 01/16/2016    History reviewed. No pertinent surgical history.   OB History   None      Home Medications    Prior to Admission medications   Medication Sig Start Date End Date Taking? Authorizing Provider  HYDROcodone-homatropine (HYDROMET) 5-1.5 MG/5ML syrup Take 5 mLs by mouth every 6 (six) hours as needed for cough. 11/25/17   Elvina Sidle, MD  ibuprofen (ADVIL,MOTRIN) 800 MG tablet Take 1 tablet (800 mg total) by mouth 3 (three) times daily with meals. 07/02/18   Trinadee Verhagen, PA-C  meloxicam (MOBIC) 15 MG tablet Take 1 tablet (15 mg total) by mouth  daily. 11/25/17   Elvina Sidle, MD    Family History Family History  Problem Relation Age of Onset  . Asthma Mother   . Asthma Father   . Brain cancer Brother        Neuroblastoma   . Diabetes Maternal Aunt     Social History Social History   Tobacco Use  . Smoking status: Never Smoker  . Smokeless tobacco: Never Used  Substance Use Topics  . Alcohol use: No    Alcohol/week: 0.0 standard drinks  . Drug use: No     Allergies   Amoxicillin   Review of Systems Review of Systems  Respiratory: Positive for cough. Negative for shortness of breath.   Cardiovascular: Negative for chest pain.  Musculoskeletal: Positive for back pain.     Physical Exam Updated Vital Signs BP (!) 119/91   Pulse 88   Temp 98.7 F (37.1 C)   Resp 16   LMP 06/20/2018   SpO2 100%   Physical Exam  Constitutional: She is oriented to person, place, and time. She appears well-developed and well-nourished. No distress.  HENT:  Head: Normocephalic and atraumatic.  Eyes: EOM are normal.  Neck: Normal range of motion.  Cardiovascular: Normal rate, regular rhythm and intact distal pulses.  Pulmonary/Chest: Effort normal and breath sounds normal.  Abdominal: Soft. She exhibits no distension and no mass. There is no tenderness. There is no  guarding.  Musculoskeletal: Normal range of motion. She exhibits tenderness. She exhibits no edema or deformity.  Tenderness palpation of left side thoracic back overlying the musculature.  No pain over midline spine.  No pain on the right side.  Increased pain with active range of motion of the upper extremities and movement of the torso.  No pain with movement of the lower extremities.  No saddle paresthesia. Pt ambulatory  Neurological: She is alert and oriented to person, place, and time.  Skin: Skin is warm. Capillary refill takes less than 2 seconds. No rash noted.  Psychiatric: She has a normal mood and affect.  Nursing note and vitals  reviewed.    ED Treatments / Results  Labs (all labs ordered are listed, but only abnormal results are displayed) Labs Reviewed - No data to display  EKG None  Radiology Dg Chest 2 View  Result Date: 07/02/2018 CLINICAL DATA:  Cough and back pain EXAM: CHEST - 2 VIEW COMPARISON:  None FINDINGS: The heart size and mediastinal contours are within normal limits. Both lungs are clear. The visualized skeletal structures are unremarkable. IMPRESSION: No active cardiopulmonary disease. Electronically Signed   By: Signa Kell M.D.   On: 07/02/2018 16:32    Procedures Procedures (including critical care time)  Medications Ordered in ED Medications  ibuprofen (ADVIL,MOTRIN) tablet 800 mg (has no administration in time range)     Initial Impression / Assessment and Plan / ED Course  I have reviewed the triage vital signs and the nursing notes.  Pertinent labs & imaging results that were available during my care of the patient were reviewed by me and considered in my medical decision making (see chart for details).     Patient presented for evaluation of back pain.  Physical exam reassuring, she is afebrile not tachycardic.  Appears nontoxic.  No red flags of back pain.  However, patient also has associated cough.  Will obtain x-ray to rule out pneumonia or pneumothorax.  Low suspicion for PE.  Pain is reproducible with palpation of the musculature, likely muscular pain.  X-ray viewed interpreted by me, no pneumonia, pneumothorax, or effusion.  Discussed with patient.  Discussed symptomatic treatment with NSAIDs, Tylenol, and muscle creams.  At this time, patient proceed for discharge.  Return precautions given.  Patient states she understands and agrees to plan.   Final Clinical Impressions(s) / ED Diagnoses   Final diagnoses:  Muscle strain  Acute left-sided thoracic back pain    ED Discharge Orders         Ordered    ibuprofen (ADVIL,MOTRIN) 800 MG tablet  3 times daily  with meals     07/02/18 1639           Huie Ghuman, PA-C 07/02/18 1715    Terrilee Files, MD 07/03/18 1018

## 2018-10-04 ENCOUNTER — Encounter: Payer: Self-pay | Admitting: Internal Medicine

## 2019-03-16 ENCOUNTER — Encounter: Payer: Self-pay | Admitting: *Deleted

## 2019-04-18 ENCOUNTER — Encounter: Payer: Self-pay | Admitting: Internal Medicine

## 2019-05-08 ENCOUNTER — Other Ambulatory Visit (HOSPITAL_COMMUNITY)
Admission: RE | Admit: 2019-05-08 | Discharge: 2019-05-08 | Disposition: A | Discharge status: Home / Self Care | Payer: Self-pay | Source: Ambulatory Visit | Attending: Internal Medicine | Admitting: Internal Medicine

## 2019-05-08 ENCOUNTER — Other Ambulatory Visit: Payer: Self-pay

## 2019-05-08 ENCOUNTER — Ambulatory Visit (INDEPENDENT_AMBULATORY_CARE_PROVIDER_SITE_OTHER): Payer: Self-pay | Admitting: Internal Medicine

## 2019-05-08 ENCOUNTER — Encounter: Payer: Self-pay | Admitting: Internal Medicine

## 2019-05-08 VITALS — BP 107/76 | HR 99 | Temp 98.5°F | Wt 108.3 lb

## 2019-05-08 DIAGNOSIS — Z3009 Encounter for other general counseling and advice on contraception: Secondary | ICD-10-CM

## 2019-05-08 DIAGNOSIS — Z124 Encounter for screening for malignant neoplasm of cervix: Secondary | ICD-10-CM | POA: Insufficient documentation

## 2019-05-08 MED ORDER — PRENATAL VITAMIN 27-0.8 MG PO TABS
1.0000 | ORAL_TABLET | Freq: Every day | ORAL | 2 refills | Status: DC
Start: 2019-05-08 — End: 2021-08-16

## 2019-05-08 NOTE — Progress Notes (Signed)
   CC: cervical cancer screening, family planning  HPI:  [name] is a 33 y.o. female with PMH below.  Today we will address cervical cancer screening, family planning  Please see A&P for status of the patient's chronic medical conditions  Past Medical History:  Diagnosis Date  . Eczema    Review of Systems:  ROS: Pulmonary: pt denies increased work of breathing, shortness of breath,  Cardiac: pt denies palpitations, chest pain,  Abdominal: pt denies abdominal pain, nausea, vomiting, or diarrhea   Physical Exam:  Vitals:   05/08/19 1515  BP: 107/76  Pulse: 99  Temp: 98.5 F (36.9 C)  TempSrc: Oral  SpO2: 100%  Weight: 108 lb 4.8 oz (49.1 kg)   Cardiac:  normal rate and rhythm, clear s1 and s2, no murmurs, rubs or gallops, no LE edema Pulmonary: CTAB, not in distress Abdominal: non distended abdomen, soft and nontender Psych: Alert, conversant, in good spirits   Social History   Socioeconomic History  . Marital status: Single    Spouse name: Not on file  . Number of children: Not on file  . Years of education: Not on file  . Highest education level: Not on file  Occupational History  . Not on file  Social Needs  . Financial resource strain: Not on file  . Food insecurity    Worry: Not on file    Inability: Not on file  . Transportation needs    Medical: Not on file    Non-medical: Not on file  Tobacco Use  . Smoking status: Never Smoker  . Smokeless tobacco: Never Used  Substance and Sexual Activity  . Alcohol use: No    Alcohol/week: 0.0 standard drinks  . Drug use: No  . Sexual activity: Not on file  Lifestyle  . Physical activity    Days per week: Not on file    Minutes per session: Not on file  . Stress: Not on file  Relationships  . Social Herbalist on phone: Not on file    Gets together: Not on file    Attends religious service: Not on file    Active member of club or organization: Not on file    Attends meetings of  clubs or organizations: Not on file    Relationship status: Not on file  . Intimate partner violence    Fear of current or ex partner: Not on file    Emotionally abused: Not on file    Physically abused: Not on file    Forced sexual activity: Not on file  Other Topics Concern  . Not on file  Social History Narrative  . Not on file    Family History  Problem Relation Age of Onset  . Asthma Mother   . Asthma Father   . Brain cancer Brother        Neuroblastoma   . Diabetes Maternal Aunt     Assessment & Plan:   See Encounters Tab for problem based charting.  Patient discussed with Dr. Dareen Piano

## 2019-05-08 NOTE — Patient Instructions (Addendum)
Ms Sabina you are doing well.  Please take this prenatal vitamin while you are trying to get pregnant.  It will be very important to make sure to not drink alcohol while trying to get pregnant.  You will need to get the flu shot in one month.  You can take over the counter pregnancy test to see if you're pregnant and we can do a confirmatory test here when your home test is positive.

## 2019-05-10 DIAGNOSIS — Z3009 Encounter for other general counseling and advice on contraception: Secondary | ICD-10-CM | POA: Insufficient documentation

## 2019-05-10 LAB — CYTOLOGY - PAP
Diagnosis: NEGATIVE
HPV: NOT DETECTED

## 2019-05-10 NOTE — Assessment & Plan Note (Signed)
Pt currently in a monogamous relationship with her boyfriend they are talking of getting married and having children.  They wish to go ahead and begin trying to have children they would like two.  She does not drink alcohol or smoke.  She has no family history which could affect the baby.  Her only significant family history is her brother had neuroblastoma.  Since it is autosomal dominant and no other family history, most cases are sporadic.  She does not have this it would be unlikely this will be an issue.    -encourage continued abstinence from alcohol, recreational drugs, smoking -will start pt on prenatal vitamin -counseled she can come here to confirm pregnancy after otc kit is positive -she will need to get the flu shot in about a month either here or at a pharmacy counseled her on the importance of this

## 2019-05-10 NOTE — Assessment & Plan Note (Signed)
Performed pap smear with HPV cotesting today for cervical cancer screening.

## 2019-05-11 ENCOUNTER — Telehealth: Payer: Self-pay | Admitting: Internal Medicine

## 2019-05-11 NOTE — Telephone Encounter (Signed)
Discussed normal pap smear results with patient, she has started taking her prenatal vitamins

## 2019-05-16 NOTE — Progress Notes (Signed)
Internal Medicine Clinic Attending  Case discussed with Dr. Winfrey  at the time of the visit.  We reviewed the resident's history and exam and pertinent patient test results.  I agree with the assessment, diagnosis, and plan of care documented in the resident's note.  

## 2019-06-07 ENCOUNTER — Ambulatory Visit: Payer: Self-pay

## 2019-07-03 ENCOUNTER — Ambulatory Visit: Payer: Self-pay

## 2019-07-03 ENCOUNTER — Encounter: Payer: Self-pay | Admitting: Internal Medicine

## 2019-08-08 LAB — OB RESULTS CONSOLE HEPATITIS B SURFACE ANTIGEN: Hepatitis B Surface Ag: NEGATIVE

## 2019-08-08 LAB — OB RESULTS CONSOLE HIV ANTIBODY (ROUTINE TESTING): HIV: NONREACTIVE

## 2019-08-08 LAB — OB RESULTS CONSOLE RUBELLA ANTIBODY, IGM: Rubella: IMMUNE

## 2019-08-10 ENCOUNTER — Encounter (HOSPITAL_COMMUNITY): Payer: Self-pay

## 2019-08-10 ENCOUNTER — Other Ambulatory Visit (HOSPITAL_COMMUNITY): Payer: Self-pay | Admitting: Nurse Practitioner

## 2019-08-10 DIAGNOSIS — Z3A13 13 weeks gestation of pregnancy: Secondary | ICD-10-CM

## 2019-08-10 DIAGNOSIS — Z3682 Encounter for antenatal screening for nuchal translucency: Secondary | ICD-10-CM

## 2019-08-11 ENCOUNTER — Other Ambulatory Visit (HOSPITAL_COMMUNITY): Payer: Self-pay | Admitting: *Deleted

## 2019-08-11 ENCOUNTER — Ambulatory Visit (HOSPITAL_COMMUNITY)
Admission: RE | Admit: 2019-08-11 | Discharge: 2019-08-11 | Disposition: A | Discharge status: Home / Self Care | Payer: Medicaid Other | Source: Ambulatory Visit | Attending: Obstetrics and Gynecology | Admitting: Obstetrics and Gynecology

## 2019-08-11 ENCOUNTER — Encounter (HOSPITAL_COMMUNITY): Payer: Self-pay

## 2019-08-11 ENCOUNTER — Other Ambulatory Visit: Payer: Self-pay

## 2019-08-11 ENCOUNTER — Ambulatory Visit (HOSPITAL_COMMUNITY): Payer: Medicaid Other | Admitting: *Deleted

## 2019-08-11 ENCOUNTER — Ambulatory Visit (HOSPITAL_COMMUNITY): Payer: Medicaid Other

## 2019-08-11 VITALS — BP 107/68 | HR 98 | Temp 98.0°F | Wt 110.1 lb

## 2019-08-11 DIAGNOSIS — O359XX0 Maternal care for (suspected) fetal abnormality and damage, unspecified, not applicable or unspecified: Secondary | ICD-10-CM | POA: Insufficient documentation

## 2019-08-11 DIAGNOSIS — Z3A11 11 weeks gestation of pregnancy: Secondary | ICD-10-CM | POA: Insufficient documentation

## 2019-08-11 DIAGNOSIS — Z362 Encounter for other antenatal screening follow-up: Secondary | ICD-10-CM

## 2019-08-11 DIAGNOSIS — Z3682 Encounter for antenatal screening for nuchal translucency: Secondary | ICD-10-CM

## 2019-08-11 DIAGNOSIS — Z3687 Encounter for antenatal screening for uncertain dates: Secondary | ICD-10-CM | POA: Diagnosis not present

## 2019-08-11 DIAGNOSIS — Z3A13 13 weeks gestation of pregnancy: Secondary | ICD-10-CM

## 2019-09-08 ENCOUNTER — Encounter (HOSPITAL_COMMUNITY): Payer: Self-pay

## 2019-09-08 ENCOUNTER — Other Ambulatory Visit: Payer: Self-pay

## 2019-09-08 ENCOUNTER — Other Ambulatory Visit (HOSPITAL_COMMUNITY): Payer: Self-pay | Admitting: *Deleted

## 2019-09-08 ENCOUNTER — Other Ambulatory Visit (HOSPITAL_COMMUNITY): Payer: Self-pay | Admitting: Obstetrics

## 2019-09-08 ENCOUNTER — Ambulatory Visit (HOSPITAL_COMMUNITY)
Admission: RE | Admit: 2019-09-08 | Discharge: 2019-09-08 | Disposition: A | Discharge status: Home / Self Care | Payer: Medicaid Other | Source: Ambulatory Visit | Attending: Obstetrics and Gynecology | Admitting: Obstetrics and Gynecology

## 2019-09-08 ENCOUNTER — Ambulatory Visit (HOSPITAL_COMMUNITY): Payer: Medicaid Other | Admitting: *Deleted

## 2019-09-08 VITALS — BP 116/68 | HR 95 | Temp 98.2°F

## 2019-09-08 DIAGNOSIS — Z362 Encounter for other antenatal screening follow-up: Secondary | ICD-10-CM

## 2019-09-08 DIAGNOSIS — Z363 Encounter for antenatal screening for malformations: Secondary | ICD-10-CM

## 2019-09-08 DIAGNOSIS — O359XX0 Maternal care for (suspected) fetal abnormality and damage, unspecified, not applicable or unspecified: Secondary | ICD-10-CM

## 2019-09-08 DIAGNOSIS — Z3A15 15 weeks gestation of pregnancy: Secondary | ICD-10-CM

## 2019-09-22 NOTE — L&D Delivery Note (Signed)
OB/GYN Faculty Practice Delivery Note  Kaylee Gibson is a 34 y.o. G1P0 s/p VAVD at [redacted]w[redacted]d. She was admitted for IOL for decel on NST at > 40 weeks.   ROM: 9h 55m with meconium-stained fluid GBS Status: Positive/-- (05/13 0000) Maximum Maternal Temperature: 99.36F  Labor Progress: . Initial SVE: 0.5/50/ballotable. Patient received Foley balloon, Cytotec, AROM and Pitocin. She progressed to complete and pushed intermittently without much success. She eventually received Epidural and began pushing again however with little progress. Patient complete for > 8 hours.    Delivery Date/Time: 6/8 @ 1337  Indication for operative vaginal delivery: Maternal exhaustion. Pushing intermittently for 8+ hours with minimal progress.  Patient was examined and found to be fully dilated with fetal station of +2/+3.  Patient's bladder was noted to be empty, and there were no known fetal contraindications to operative vaginal delivery. EFW was 3200g by Leopolds/recent ultrasound.  FHR tracing remarkable for Cat II tracing with some variables but overall reassuring with moderate variability.   Risks of vacuum assistance were discussed in detail, including but not limited to, bleeding, infection, damage to maternal tissues, fetal cephalohematoma, inability to effect vaginal delivery of the head or shoulder dystocia that cannot be resolved by established maneuvers and need for emergency cesarean section.  Patient gave verbal consent.  The soft Kiwi vacuum cup was positioned over the sagittal suture 3 cm anterior to posterior fontanelle.  Pressure was then increased to 500 mmHg, and the patient was instructed to push.  Pulling was administered along the pelvic curve while patient was pushing; there were 6 contractions and 1 popoff.  Vacuum was reduced in between contractions. Head then delivered automatically in LOA position with right compound hand. No nuchal cord present. Shoulder and body delivered in usual fashion.  Infant with spontaneous cry, placed on mother's abdomen, dried and stimulated. Cord clamped x 2 after 1-minute delay, and cut by FOB. Cord blood drawn. Placenta delivered spontaneously with gentle cord traction. Fundus initially boggy but firmed with massage and Pitocin. Labia, perineum, vagina, and cervix inspected inspected with 2nd degree perineal and left labial lacerations which were repaired with 3-0 Vicryl in a standard fashion.  Baby Weight: pending  Placenta: Sent to L&D Complications: VAVD Lacerations: 2nd degree perineal and left labial EBL: 820 mL Analgesia: Epidural   Infant:  APGAR (1 MIN): 9   APGAR (5 MINS): 9   APGAR (10 MINS):     Jerilynn Birkenhead, MD OB Family Medicine Fellow, [facility] for [facility], [facility] 02/27/2020, 2:14 PM

## 2019-10-06 ENCOUNTER — Other Ambulatory Visit (HOSPITAL_COMMUNITY): Payer: Self-pay | Admitting: *Deleted

## 2019-10-06 ENCOUNTER — Encounter (HOSPITAL_COMMUNITY): Payer: Self-pay

## 2019-10-06 ENCOUNTER — Ambulatory Visit (HOSPITAL_COMMUNITY)
Admission: RE | Admit: 2019-10-06 | Discharge: 2019-10-06 | Disposition: A | Discharge status: Home / Self Care | Payer: Medicaid Other | Source: Ambulatory Visit | Attending: Obstetrics | Admitting: Obstetrics

## 2019-10-06 ENCOUNTER — Other Ambulatory Visit: Payer: Self-pay

## 2019-10-06 ENCOUNTER — Ambulatory Visit (HOSPITAL_COMMUNITY): Payer: Medicaid Other | Admitting: *Deleted

## 2019-10-06 VITALS — BP 106/64 | HR 97 | Temp 97.8°F

## 2019-10-06 DIAGNOSIS — O359XX0 Maternal care for (suspected) fetal abnormality and damage, unspecified, not applicable or unspecified: Secondary | ICD-10-CM

## 2019-10-06 DIAGNOSIS — O0990 Supervision of high risk pregnancy, unspecified, unspecified trimester: Secondary | ICD-10-CM

## 2019-10-06 DIAGNOSIS — Z3A19 19 weeks gestation of pregnancy: Secondary | ICD-10-CM | POA: Diagnosis not present

## 2019-10-06 DIAGNOSIS — O35EXX0 Maternal care for other (suspected) fetal abnormality and damage, fetal genitourinary anomalies, not applicable or unspecified: Secondary | ICD-10-CM

## 2019-10-06 DIAGNOSIS — O358XX0 Maternal care for other (suspected) fetal abnormality and damage, not applicable or unspecified: Secondary | ICD-10-CM

## 2019-10-06 DIAGNOSIS — Z362 Encounter for other antenatal screening follow-up: Secondary | ICD-10-CM | POA: Diagnosis not present

## 2019-10-25 ENCOUNTER — Encounter (HOSPITAL_COMMUNITY): Payer: Self-pay | Admitting: Family Medicine

## 2019-10-25 ENCOUNTER — Other Ambulatory Visit: Payer: Self-pay

## 2019-10-25 ENCOUNTER — Inpatient Hospital Stay (HOSPITAL_BASED_OUTPATIENT_CLINIC_OR_DEPARTMENT_OTHER): Payer: Medicaid Other

## 2019-10-25 ENCOUNTER — Inpatient Hospital Stay (HOSPITAL_COMMUNITY)
Admission: EM | Admit: 2019-10-25 | Discharge: 2019-10-25 | Disposition: A | Discharge status: Home / Self Care | Payer: Medicaid Other | Attending: Family Medicine | Admitting: Family Medicine

## 2019-10-25 DIAGNOSIS — Z3A22 22 weeks gestation of pregnancy: Secondary | ICD-10-CM

## 2019-10-25 DIAGNOSIS — O4702 False labor before 37 completed weeks of gestation, second trimester: Secondary | ICD-10-CM

## 2019-10-25 DIAGNOSIS — O26892 Other specified pregnancy related conditions, second trimester: Secondary | ICD-10-CM

## 2019-10-25 DIAGNOSIS — R109 Unspecified abdominal pain: Secondary | ICD-10-CM | POA: Insufficient documentation

## 2019-10-25 DIAGNOSIS — O359XX0 Maternal care for (suspected) fetal abnormality and damage, unspecified, not applicable or unspecified: Secondary | ICD-10-CM

## 2019-10-25 DIAGNOSIS — Z880 Allergy status to penicillin: Secondary | ICD-10-CM | POA: Diagnosis not present

## 2019-10-25 LAB — COMPREHENSIVE METABOLIC PANEL
ALT: 17 U/L (ref 0–44)
AST: 23 U/L (ref 15–41)
Albumin: 3.5 g/dL (ref 3.5–5.0)
Alkaline Phosphatase: 36 U/L — ABNORMAL LOW (ref 38–126)
Anion gap: 9 (ref 5–15)
BUN: 9 mg/dL (ref 6–20)
CO2: 22 mmol/L (ref 22–32)
Calcium: 8.7 mg/dL — ABNORMAL LOW (ref 8.9–10.3)
Chloride: 106 mmol/L (ref 98–111)
Creatinine, Ser: 0.56 mg/dL (ref 0.44–1.00)
GFR calc Af Amer: >60 mL/min (ref >60)
GFR calc non Af Amer: >60 mL/min (ref >60)
Glucose, Bld: 81 mg/dL (ref 70–99)
Potassium: 3.9 mmol/L (ref 3.5–5.1)
Sodium: 137 mmol/L (ref 135–145)
Total Bilirubin: 0.6 mg/dL (ref 0.3–1.2)
Total Protein: 7.4 g/dL (ref 6.5–8.1)

## 2019-10-25 LAB — URINALYSIS, ROUTINE W REFLEX MICROSCOPIC
Bacteria, UA: NONE SEEN
Bilirubin Urine: NEGATIVE
Glucose, UA: NEGATIVE mg/dL
Hgb urine dipstick: NEGATIVE
Ketones, ur: NEGATIVE mg/dL
Leukocytes,Ua: NEGATIVE
Nitrite: NEGATIVE
Protein, ur: 30 mg/dL — AB
Specific Gravity, Urine: 1.019 (ref 1.005–1.030)
pH: 6 (ref 5.0–8.0)

## 2019-10-25 LAB — WET PREP, GENITAL
Sperm: NONE SEEN
Trich, Wet Prep: NONE SEEN
Yeast Wet Prep HPF POC: NONE SEEN

## 2019-10-25 LAB — CBC WITH DIFFERENTIAL/PLATELET
Abs Immature Granulocytes: 0.03 10*3/uL (ref 0.00–0.07)
Basophils Absolute: 0 10*3/uL (ref 0.0–0.1)
Basophils Relative: 0 %
Eosinophils Absolute: 0 10*3/uL (ref 0.0–0.5)
Eosinophils Relative: 1 %
HCT: 31.4 % — ABNORMAL LOW (ref 36.0–46.0)
Hemoglobin: 9.9 g/dL — ABNORMAL LOW (ref 12.0–15.0)
Immature Granulocytes: 0 %
Lymphocytes Relative: 14 %
Lymphs Abs: 1 10*3/uL (ref 0.7–4.0)
MCH: 30.4 pg (ref 26.0–34.0)
MCHC: 31.5 g/dL (ref 30.0–36.0)
MCV: 96.3 fL (ref 80.0–100.0)
Monocytes Absolute: 0.5 10*3/uL (ref 0.1–1.0)
Monocytes Relative: 7 %
Neutro Abs: 5.3 10*3/uL (ref 1.7–7.7)
Neutrophils Relative %: 78 %
Platelets: 328 10*3/uL (ref 150–400)
RBC: 3.26 MIL/uL — ABNORMAL LOW (ref 3.87–5.11)
RDW: 13.2 % (ref 11.5–15.5)
WBC: 6.8 10*3/uL (ref 4.0–10.5)
nRBC: 0 % (ref 0.0–0.2)

## 2019-10-25 LAB — LIPASE, BLOOD: Lipase: 38 U/L (ref 11–51)

## 2019-10-25 MED ORDER — LACTATED RINGERS IV BOLUS
1000.0000 mL | Freq: Once | INTRAVENOUS | Status: AC
  Administered 2019-10-25: 1000 mL via INTRAVENOUS

## 2019-10-25 NOTE — Progress Notes (Signed)
Dr Jolayne Panther given report on patient.  Notified of fhr and uterine irritability. Orders to bolus pt with 1 liter of LR and to check pt cervix.

## 2019-10-25 NOTE — Progress Notes (Signed)
Dr Alvester Morin notified that pts cervix is a dimple but anterior.  Orders to transfer to MAU.  [facility] notified.  [facility] calls and gives report to maternity assessment unit.

## 2019-10-25 NOTE — Progress Notes (Signed)
Pt is 22w 3d G1P0 who reports uncomplicated pregnancy.  She began having bilateral groin pain yesterday that subsided but returned this afternoon.  Pt says the pain is constant and sharp.  Pt reports positive fetal movement, no vaginal bleeding, and no LOF. Toco applied and fhr Dopplered at 141bpm.  Will continue to assess.

## 2019-10-25 NOTE — MAU Note (Signed)
Pt presents to MAU from [facility], she was evaluated there by the [facility] nurse. [facility] reported pt was having UI, she was checked and cervix was a dimple/anterior. Pt is reporting mild cramping, denies VB and LOF. +FM

## 2019-10-25 NOTE — ED Triage Notes (Signed)
Patient is complaining of diffuse abd pain that started yesterday while lying down. Denies notifying OB/GYN. Reports feeling the fetus move since abd pain started. Denies any vaginal bleeding or discharge.

## 2019-10-25 NOTE — Progress Notes (Signed)
Kaylee Gibson called to report a pt with diffuse abdominal pain.  [facility] notes that pt is only 22w 3d so calls Dr Jolayne Panther to verify that MD would like [facility] to go assess pt.  Dr Jolayne Panther does request that at this time.

## 2019-10-25 NOTE — MAU Provider Note (Signed)
Chief Complaint: Abdominal Pain and Routine Prenatal Visit   First Provider Initiated Contact with Patient 10/25/19 1835      SUBJECTIVE HPI: Kaylee Gibson is a 34 y.o. G1P0 at [redacted]w[redacted]d by LMP who presents to maternity admissions transferred to MAU from [facility] for abdominal pain. She reports constant lower and mid abdominal sharp pain starting today.  She denies any vaginal bleeding, vaginal discharge, or leakage of fluid. She is feeling normal fetal movement. She has not tried any treatments.  There is normal fetal movement.   HPI  Past Medical History:  Diagnosis Date  . Eczema   . Medical history non-contributory    Past Surgical History:  Procedure Laterality Date  . NO PAST SURGERIES     Social History   Socioeconomic History  . Marital status: Single    Spouse name: Not on file  . Number of children: Not on file  . Years of education: Not on file  . Highest education level: Not on file  Occupational History  . Not on file  Tobacco Use  . Smoking status: Never Smoker  . Smokeless tobacco: Never Used  Substance and Sexual Activity  . Alcohol use: No    Alcohol/week: 0.0 standard drinks  . Drug use: No  . Sexual activity: Yes    Birth control/protection: None  Other Topics Concern  . Not on file  Social History Narrative  . Not on file   Social Determinants of Health   Financial Resource Strain:   . Difficulty of Paying Living Expenses: Not on file  Food Insecurity:   . Worried About Charity fundraiser in the Last Year: Not on file  . Ran Out of Food in the Last Year: Not on file  Transportation Needs:   . Lack of Transportation (Medical): Not on file  . Lack of Transportation (Non-Medical): Not on file  Physical Activity:   . Days of Exercise per Week: Not on file  . Minutes of Exercise per Session: Not on file  Stress:   . Feeling of Stress : Not on file  Social Connections:   . Frequency of Communication with Friends and Family: Not on file  .  Frequency of Social Gatherings with Friends and Family: Not on file  . Attends Religious Services: Not on file  . Active Member of Clubs or Organizations: Not on file  . Attends Archivist Meetings: Not on file  . Marital Status: Not on file  Intimate Partner Violence:   . Fear of Current or Ex-Partner: Not on file  . Emotionally Abused: Not on file  . Physically Abused: Not on file  . Sexually Abused: Not on file   No current facility-administered medications on file prior to encounter.   Current Outpatient Medications on File Prior to Encounter  Medication Sig Dispense Refill  . Prenatal Vit-Fe Fumarate-FA (PRENATAL VITAMIN) 27-0.8 MG TABS Take 1 Dose by mouth daily. 90 tablet 2   Allergies  Allergen Reactions  . Penicillins Rash    ROS:  Review of Systems  Constitutional: Negative for chills, fatigue and fever.  Respiratory: Negative for shortness of breath.   Cardiovascular: Negative for chest pain.  Gastrointestinal: Positive for abdominal pain. Negative for constipation, nausea and vomiting.  Genitourinary: Negative for difficulty urinating, dysuria, flank pain, pelvic pain, vaginal bleeding, vaginal discharge and vaginal pain.  Musculoskeletal: Negative for back pain.  Neurological: Negative for dizziness and headaches.  Psychiatric/Behavioral: Negative.      I have reviewed patient's  Past Medical Hx, Surgical Hx, Family Hx, Social Hx, medications and allergies.   Physical Exam   Patient Vitals for the past 24 hrs:  BP Temp Temp src Pulse Resp SpO2 Height Weight  10/25/19 2053 109/62 98.4 F (36.9 C) Oral 90 17 -- -- --  10/25/19 1920 112/64 -- -- 97 -- 100 % -- --  10/25/19 1910 -- 98.4 F (36.9 C) Oral -- -- -- -- --  10/25/19 1813 114/72 98.4 F (36.9 C) -- 96 18 100 % -- --  10/25/19 1740 (!) 144/93 98.5 F (36.9 C) -- 99 17 100 % -- --  10/25/19 1422 -- -- -- -- -- -- 5\' 1"  (1.549 m) 52.6 kg  10/25/19 1421 107/73 98.2 F (36.8 C) Oral 100 18  100 % -- --   Constitutional: Well-developed, well-nourished female in no acute distress.  Cardiovascular: normal rate Respiratory: normal effort GI: Abd soft, non-tender. Pos BS x 4 MS: Extremities nontender, no edema, normal ROM Neurologic: Alert and oriented x 4.  GU: Neg CVAT.  Cervix 0.5/thick by RROB nurse at [facility] today  FHT 152 by doppler  LAB RESULTS Results for orders placed or performed during the hospital encounter of 10/25/19 (from the past 24 hour(s))  CBC with Differential     Status: Abnormal   Collection Time: 10/25/19  3:27 PM  Result Value Ref Range   WBC 6.8 4.0 - 10.5 K/uL   RBC 3.26 (L) 3.87 - 5.11 MIL/uL   Hemoglobin 9.9 (L) 12.0 - 15.0 g/dL   HCT 12/23/19 (L) 78.6 - 76.7 %   MCV 96.3 80.0 - 100.0 fL   MCH 30.4 26.0 - 34.0 pg   MCHC 31.5 30.0 - 36.0 g/dL   RDW 20.9 47.0 - 96.2 %   Platelets 328 150 - 400 K/uL   nRBC 0.0 0.0 - 0.2 %   Neutrophils Relative % 78 %   Neutro Abs 5.3 1.7 - 7.7 K/uL   Lymphocytes Relative 14 %   Lymphs Abs 1.0 0.7 - 4.0 K/uL   Monocytes Relative 7 %   Monocytes Absolute 0.5 0.1 - 1.0 K/uL   Eosinophils Relative 1 %   Eosinophils Absolute 0.0 0.0 - 0.5 K/uL   Basophils Relative 0 %   Basophils Absolute 0.0 0.0 - 0.1 K/uL   Immature Granulocytes 0 %   Abs Immature Granulocytes 0.03 0.00 - 0.07 K/uL  Comprehensive metabolic panel     Status: Abnormal   Collection Time: 10/25/19  3:27 PM  Result Value Ref Range   Sodium 137 135 - 145 mmol/L   Potassium 3.9 3.5 - 5.1 mmol/L   Chloride 106 98 - 111 mmol/L   CO2 22 22 - 32 mmol/L   Glucose, Bld 81 70 - 99 mg/dL   BUN 9 6 - 20 mg/dL   Creatinine, Ser 12/23/19 0.44 - 1.00 mg/dL   Calcium 8.7 (L) 8.9 - 10.3 mg/dL   Total Protein 7.4 6.5 - 8.1 g/dL   Albumin 3.5 3.5 - 5.0 g/dL   AST 23 15 - 41 U/L   ALT 17 0 - 44 U/L   Alkaline Phosphatase 36 (L) 38 - 126 U/L   Total Bilirubin 0.6 0.3 - 1.2 mg/dL   GFR calc non Af Amer >60 >60 mL/min   GFR calc Af Amer >60 >60 mL/min   Anion  gap 9 5 - 15  Lipase, blood     Status: None   Collection Time: 10/25/19  3:27 PM  Result  Value Ref Range   Lipase 38 11 - 51 U/L  Urinalysis, Routine w reflex microscopic     Status: Abnormal   Collection Time: 10/25/19  3:48 PM  Result Value Ref Range   Color, Urine YELLOW YELLOW   APPearance CLEAR CLEAR   Specific Gravity, Urine 1.019 1.005 - 1.030   pH 6.0 5.0 - 8.0   Glucose, UA NEGATIVE NEGATIVE mg/dL   Hgb urine dipstick NEGATIVE NEGATIVE   Bilirubin Urine NEGATIVE NEGATIVE   Ketones, ur NEGATIVE NEGATIVE mg/dL   Protein, ur 30 (A) NEGATIVE mg/dL   Nitrite NEGATIVE NEGATIVE   Leukocytes,Ua NEGATIVE NEGATIVE   RBC / HPF 0-5 0 - 5 RBC/hpf   WBC, UA 0-5 0 - 5 WBC/hpf   Bacteria, UA NONE SEEN NONE SEEN   Squamous Epithelial / LPF 0-5 0 - 5   Mucus PRESENT   Wet prep, genital     Status: Abnormal   Collection Time: 10/25/19  8:49 PM   Specimen: Genital  Result Value Ref Range   Yeast Wet Prep HPF POC NONE SEEN NONE SEEN   Trich, Wet Prep NONE SEEN NONE SEEN   Clue Cells Wet Prep HPF POC PRESENT (A) NONE SEEN   WBC, Wet Prep HPF POC MANY (A) NONE SEEN   Sperm NONE SEEN        IMAGING   MAU Management/MDM: Orders Placed This Encounter  Procedures  . Wet prep, genital  . [place] MFM OB TRANSVAGINAL  . [place] MFM OB Limited  . CBC with Differential  . Comprehensive metabolic panel  . Lipase, blood  . Urinalysis, Routine w reflex microscopic  . Assess fetal heart tones  . Discharge patient    Meds ordered this encounter  Medications  . lactated ringers bolus 1,000 mL    No evidence of preterm labor with 0.5 cm/thick cervix on exam by RN and normal cervical length of 3.9 cm on MFM [place].  Pain improved in MAU without treatment per pt.  Reviewed possible causes of pain with pt including digestive or urinary pain and vaginal infection.  Wet prep/GCC pending.  Rest/ice/heat/warm bath/Tylenol/pregnancy support belt for pain. D/C home with preterm labor precautions. F/U at [facility]  as scheduled.   ASSESSMENT 1. Abdominal pain during pregnancy in second trimester   2. Threatened preterm labor, second trimester     PLAN Discharge home Allergies as of 10/25/2019      Reactions   Penicillins Rash      Medication List    TAKE these medications   Prenatal Vitamin 27-0.8 MG Tabs Take 1 Dose by mouth daily.      Follow-up Information    Department, [facility] Follow up.   Why: As scheduled for prenatal care. Return to MAU as needed for emergencies. Contact information: [address] [place] [address] [phone]           Sharen Counter Certified Nurse-Midwife 10/25/2019  9:08 PM

## 2019-10-25 NOTE — ED Provider Notes (Signed)
[facility] DEPT Provider Note   CSN: [id number] Arrival date & time: 10/25/19  1402     History Chief Complaint  Patient presents with  . Abdominal Pain  . Routine Prenatal Visit    Kaylee Gibson is a 34 y.o. female.  HPI      34 year old female, approximately [redacted] weeks pregnant, presents with abdominal pain.  Patient states pain started today.  She notes it was a diffuse pain and felt like cramping.  She had 2 bowel movements and pain improved.  She denies any associated nausea, vomiting, fevers, chills.  She denies any vaginal bleeding or vaginal discharge.  She denies any dysuria, urinary urgency, urinary frequency.    Past Medical History:  Diagnosis Date  . Eczema   . Medical history non-contributory     Patient Active Problem List   Diagnosis Date Noted  . Family planning 05/10/2019  . Encounter for screening for cervical cancer  11/02/2017  . Eczema 10/30/2016  . Preventative health care 01/16/2016    Past Surgical History:  Procedure Laterality Date  . NO PAST SURGERIES       OB History    Gravida  1   Para      Term      Preterm      AB      Living  0     SAB      TAB      Ectopic      Multiple      Live Births              Family History  Problem Relation Age of Onset  . Asthma Mother   . Asthma Father   . Brain cancer Brother        Neuroblastoma   . Diabetes Maternal Aunt     Social History   Tobacco Use  . Smoking status: Never Smoker  . Smokeless tobacco: Never Used  Substance Use Topics  . Alcohol use: No    Alcohol/week: 0.0 standard drinks  . Drug use: No    Home Medications Prior to Admission medications   Medication Sig Start Date End Date Taking? Authorizing Provider  Prenatal Vit-Fe Fumarate-FA (PRENATAL VITAMIN) 27-0.8 MG TABS Take 1 Dose by mouth daily. 05/08/19   Katherine Roan, MD    Allergies    Penicillins  Review of Systems   Review of Systems    Constitutional: Negative for chills and fever.  Respiratory: Negative for shortness of breath.   Cardiovascular: Negative for chest pain.  Gastrointestinal: Positive for abdominal pain. Negative for nausea and vomiting.    Physical Exam Updated Vital Signs BP 107/73 (BP Location: Left Arm)   Pulse 100   Temp 98.2 F (36.8 C) (Oral)   Resp 18   Ht 5\' 1"  (1.549 m)   Wt 52.6 kg   LMP 05/09/2019 (Approximate)   SpO2 100%   BMI 21.92 kg/m   Physical Exam Vitals and nursing note reviewed.  Constitutional:      Appearance: She is well-developed.  HENT:     Head: Normocephalic and atraumatic.  Eyes:     Conjunctiva/sclera: Conjunctivae normal.  Cardiovascular:     Rate and Rhythm: Normal rate and regular rhythm.     Heart sounds: Normal heart sounds. No murmur.  Pulmonary:     Effort: Pulmonary effort is normal. No respiratory distress.     Breath sounds: Normal breath sounds. No wheezing or rales.  Abdominal:  General: Bowel sounds are normal.     Palpations: Abdomen is soft.     Tenderness: There is no abdominal tenderness.  Musculoskeletal:        General: No tenderness or deformity. Normal range of motion.     Cervical back: Neck supple.  Skin:    General: Skin is warm and dry.     Findings: No erythema or rash.  Neurological:     Mental Status: She is alert and oriented to person, place, and time.  Psychiatric:        Behavior: Behavior normal.     ED Results / Procedures / Treatments   Labs (all labs ordered are listed, but only abnormal results are displayed) Labs Reviewed  CBC WITH DIFFERENTIAL/PLATELET  COMPREHENSIVE METABOLIC PANEL  LIPASE, BLOOD  URINALYSIS, ROUTINE W REFLEX MICROSCOPIC    EKG None  Radiology No results found.  Procedures Procedures (including critical care time)  Medications Ordered in ED Medications - No data to display  ED Course  I have reviewed the triage vital signs and the nursing notes.  Pertinent labs &  imaging results that were available during my care of the patient were reviewed by me and considered in my medical decision making (see chart for details).    MDM Rules/Calculators/A&P                      Patient present with diffuse abdominal pain in pregnancy.  Her abdomen is nontender on evaluation.  She denies any other associated symptoms, aggravating or alleviating factors.  Vital signs stable.  Hemoglobin low 9.9, no comparison available.  Otherwise unremarkable.  Urine does show some protein blood work.  Rapid OB nurse at bedside to evaluate the patient.  She noted uterine irritability and an anterior cervix.  OB rapid response nurse has discussed with Dr. Alvester Morin in the MAU who recommended a liter of fluids and transfer over to MAU for further evaluation.  ED nurse is arranging transport for the patient.  Discussed with attending, Dr. Charm Barges who is agreeable with plan.   Final Clinical Impression(s) / ED Diagnoses Final diagnoses:  None    Rx / DC Orders ED Discharge Orders    None       Rueben Bash 10/25/19 2047    Terrilee Files, MD 10/26/19 1019

## 2019-10-26 LAB — GC/CHLAMYDIA PROBE AMP (~~LOC~~) NOT AT ARMC
Chlamydia: NEGATIVE
Comment: NEGATIVE
Comment: NORMAL
Neisseria Gonorrhea: NEGATIVE

## 2019-11-03 ENCOUNTER — Encounter (HOSPITAL_COMMUNITY): Payer: Self-pay

## 2019-11-03 ENCOUNTER — Other Ambulatory Visit (HOSPITAL_COMMUNITY): Payer: Self-pay | Admitting: *Deleted

## 2019-11-03 ENCOUNTER — Ambulatory Visit (HOSPITAL_COMMUNITY): Payer: Medicaid Other | Admitting: *Deleted

## 2019-11-03 ENCOUNTER — Ambulatory Visit (HOSPITAL_COMMUNITY): Payer: Medicaid Other

## 2019-11-03 ENCOUNTER — Other Ambulatory Visit: Payer: Self-pay

## 2019-11-03 ENCOUNTER — Ambulatory Visit (HOSPITAL_COMMUNITY)
Admission: RE | Admit: 2019-11-03 | Discharge: 2019-11-03 | Disposition: A | Discharge status: Home / Self Care | Payer: Medicaid Other | Source: Ambulatory Visit | Attending: Obstetrics and Gynecology | Admitting: Obstetrics and Gynecology

## 2019-11-03 VITALS — BP 96/63 | HR 95 | Temp 97.9°F

## 2019-11-03 DIAGNOSIS — O0990 Supervision of high risk pregnancy, unspecified, unspecified trimester: Secondary | ICD-10-CM

## 2019-11-03 DIAGNOSIS — O359XX0 Maternal care for (suspected) fetal abnormality and damage, unspecified, not applicable or unspecified: Secondary | ICD-10-CM | POA: Diagnosis not present

## 2019-11-03 DIAGNOSIS — O358XX0 Maternal care for other (suspected) fetal abnormality and damage, not applicable or unspecified: Secondary | ICD-10-CM | POA: Diagnosis present

## 2019-11-03 DIAGNOSIS — Z362 Encounter for other antenatal screening follow-up: Secondary | ICD-10-CM | POA: Diagnosis not present

## 2019-11-03 DIAGNOSIS — O35EXX0 Maternal care for other (suspected) fetal abnormality and damage, fetal genitourinary anomalies, not applicable or unspecified: Secondary | ICD-10-CM

## 2019-11-03 DIAGNOSIS — Z3A23 23 weeks gestation of pregnancy: Secondary | ICD-10-CM

## 2019-11-03 NOTE — Progress Notes (Unsigned)
[id number]  

## 2019-11-16 ENCOUNTER — Telehealth (HOSPITAL_COMMUNITY): Payer: Self-pay | Admitting: *Deleted

## 2019-11-16 LAB — MATERNIT21 PLUS CORE+SCA

## 2019-11-16 NOTE — Telephone Encounter (Signed)
TC to patient explaining that the lab sample drawn on 11/03/19 was held up in transit due to bad weather. [facility] requesting new sample. Pt voiced understanding and will come in tomorrow at 10 am for redraw of maternity21.

## 2019-11-17 ENCOUNTER — Ambulatory Visit (HOSPITAL_COMMUNITY): Payer: Medicaid Other | Attending: Obstetrics and Gynecology

## 2019-11-17 ENCOUNTER — Other Ambulatory Visit: Payer: Self-pay

## 2019-11-17 DIAGNOSIS — Z1379 Encounter for other screening for genetic and chromosomal anomalies: Secondary | ICD-10-CM

## 2019-11-23 ENCOUNTER — Encounter (HOSPITAL_COMMUNITY)
Admission: AD | Disposition: A | Discharge status: Home / Self Care | Payer: Self-pay | Source: Home / Self Care | Attending: Obstetrics & Gynecology

## 2019-11-23 ENCOUNTER — Other Ambulatory Visit: Payer: Self-pay

## 2019-11-23 ENCOUNTER — Encounter (HOSPITAL_COMMUNITY): Payer: Self-pay | Admitting: Obstetrics & Gynecology

## 2019-11-23 ENCOUNTER — Inpatient Hospital Stay (HOSPITAL_COMMUNITY)
Admission: AD | Admit: 2019-11-23 | Discharge: 2019-11-25 | DRG: 818 | Disposition: A | Discharge status: Home / Self Care | Payer: Medicaid Other | Attending: Obstetrics & Gynecology | Admitting: Obstetrics & Gynecology

## 2019-11-23 ENCOUNTER — Inpatient Hospital Stay (HOSPITAL_COMMUNITY): Payer: Medicaid Other | Admitting: Anesthesiology

## 2019-11-23 ENCOUNTER — Inpatient Hospital Stay (HOSPITAL_COMMUNITY): Payer: Medicaid Other

## 2019-11-23 DIAGNOSIS — Z9049 Acquired absence of other specified parts of digestive tract: Secondary | ICD-10-CM

## 2019-11-23 DIAGNOSIS — K3580 Unspecified acute appendicitis: Secondary | ICD-10-CM | POA: Diagnosis present

## 2019-11-23 DIAGNOSIS — O99612 Diseases of the digestive system complicating pregnancy, second trimester: Secondary | ICD-10-CM | POA: Diagnosis present

## 2019-11-23 DIAGNOSIS — O99619 Diseases of the digestive system complicating pregnancy, unspecified trimester: Secondary | ICD-10-CM | POA: Diagnosis present

## 2019-11-23 DIAGNOSIS — Z3A26 26 weeks gestation of pregnancy: Secondary | ICD-10-CM | POA: Diagnosis not present

## 2019-11-23 DIAGNOSIS — Z20822 Contact with and (suspected) exposure to covid-19: Secondary | ICD-10-CM | POA: Diagnosis present

## 2019-11-23 HISTORY — PX: LAPAROSCOPIC APPENDECTOMY: SHX408

## 2019-11-23 LAB — CBC WITH DIFFERENTIAL/PLATELET
Abs Immature Granulocytes: 0.14 10*3/uL — ABNORMAL HIGH (ref 0.00–0.07)
Basophils Absolute: 0 10*3/uL (ref 0.0–0.1)
Basophils Relative: 0 %
Eosinophils Absolute: 0.1 10*3/uL (ref 0.0–0.5)
Eosinophils Relative: 1 %
HCT: 28.9 % — ABNORMAL LOW (ref 36.0–46.0)
Hemoglobin: 9.1 g/dL — ABNORMAL LOW (ref 12.0–15.0)
Immature Granulocytes: 2 %
Lymphocytes Relative: 15 %
Lymphs Abs: 1.1 10*3/uL (ref 0.7–4.0)
MCH: 29.4 pg (ref 26.0–34.0)
MCHC: 31.5 g/dL (ref 30.0–36.0)
MCV: 93.5 fL (ref 80.0–100.0)
Monocytes Absolute: 0.5 10*3/uL (ref 0.1–1.0)
Monocytes Relative: 6 %
Neutro Abs: 5.6 10*3/uL (ref 1.7–7.7)
Neutrophils Relative %: 76 %
Platelets: 361 10*3/uL (ref 150–400)
RBC: 3.09 MIL/uL — ABNORMAL LOW (ref 3.87–5.11)
RDW: 12.3 % (ref 11.5–15.5)
WBC: 7.3 10*3/uL (ref 4.0–10.5)
nRBC: 0 % (ref 0.0–0.2)

## 2019-11-23 LAB — URINALYSIS, ROUTINE W REFLEX MICROSCOPIC
Bilirubin Urine: NEGATIVE
Glucose, UA: NEGATIVE mg/dL
Hgb urine dipstick: NEGATIVE
Ketones, ur: NEGATIVE mg/dL
Nitrite: NEGATIVE
Protein, ur: NEGATIVE mg/dL
Specific Gravity, Urine: 1.011 (ref 1.005–1.030)
pH: 7 (ref 5.0–8.0)

## 2019-11-23 LAB — MATERNIT21 PLUS CORE+SCA
Fetal Fraction: 7
Monosomy X (Turner Syndrome): NOT DETECTED
Result (T21): NEGATIVE
Trisomy 13 (Patau syndrome): NEGATIVE
Trisomy 18 (Edwards syndrome): NEGATIVE
Trisomy 21 (Down syndrome): NEGATIVE
XXX (Triple X Syndrome): NOT DETECTED
XXY (Klinefelter Syndrome): NOT DETECTED
XYY (Jacobs Syndrome): NOT DETECTED

## 2019-11-23 LAB — RESPIRATORY PANEL BY RT PCR (FLU A&B, COVID)
Influenza A by PCR: NEGATIVE
Influenza B by PCR: NEGATIVE
SARS Coronavirus 2 by RT PCR: NEGATIVE

## 2019-11-23 LAB — TYPE AND SCREEN
ABO/RH(D): O POS
Antibody Screen: NEGATIVE

## 2019-11-23 LAB — ABO/RH: ABO/RH(D): O POS

## 2019-11-23 SURGERY — APPENDECTOMY, LAPAROSCOPIC
Anesthesia: General | Site: Abdomen

## 2019-11-23 MED ORDER — OXYCODONE HCL 5 MG/5ML PO SOLN: 5.0000 mg | ORAL | Status: DC | PRN

## 2019-11-23 MED ORDER — FENTANYL CITRATE (PF) 100 MCG/2ML IJ SOLN
25.0000 ug | INTRAMUSCULAR | Status: DC | PRN
  Administered 2019-11-23: 50 ug via INTRAVENOUS

## 2019-11-23 MED ORDER — ACETAMINOPHEN 500 MG PO TABS
1000.0000 mg | ORAL_TABLET | Freq: Four times a day (QID) | ORAL | Status: DC
  Filled 2019-11-23: qty 2

## 2019-11-23 MED ORDER — ONDANSETRON HCL 4 MG/2ML IJ SOLN
INTRAMUSCULAR | Status: DC | PRN
  Administered 2019-11-23: 4 mg via INTRAVENOUS

## 2019-11-23 MED ORDER — TERBUTALINE SULFATE 1 MG/ML IJ SOLN: 0.2500 mg | Freq: Once | INTRAMUSCULAR | Status: AC

## 2019-11-23 MED ORDER — LACTATED RINGERS IV SOLN: INTRAVENOUS | Status: DC | PRN

## 2019-11-23 MED ORDER — NEOSTIGMINE METHYLSULFATE 10 MG/10ML IV SOLN
INTRAVENOUS | Status: DC | PRN
  Administered 2019-11-23: 2 mg via INTRAVENOUS

## 2019-11-23 MED ORDER — PROPOFOL 10 MG/ML IV BOLUS
INTRAVENOUS | Status: AC
  Filled 2019-11-23: qty 20

## 2019-11-23 MED ORDER — ACETAMINOPHEN 10 MG/ML IV SOLN
INTRAVENOUS | Status: DC | PRN
  Administered 2019-11-23: 1000 mg via INTRAVENOUS

## 2019-11-23 MED ORDER — CEFAZOLIN SODIUM 1 G IJ SOLR
INTRAMUSCULAR | Status: AC
  Filled 2019-11-23: qty 20

## 2019-11-23 MED ORDER — SODIUM CHLORIDE 0.9 % IV SOLN
2.0000 g | INTRAVENOUS | Status: DC
  Filled 2019-11-23: qty 20

## 2019-11-23 MED ORDER — LACTATED RINGERS IV SOLN: INTRAVENOUS | Status: DC

## 2019-11-23 MED ORDER — NEOSTIGMINE METHYLSULFATE 3 MG/3ML IV SOSY
PREFILLED_SYRINGE | INTRAVENOUS | Status: AC
  Filled 2019-11-23: qty 3

## 2019-11-23 MED ORDER — PROPOFOL 10 MG/ML IV BOLUS
INTRAVENOUS | Status: DC | PRN
  Administered 2019-11-23: 160 mg via INTRAVENOUS

## 2019-11-23 MED ORDER — SUCCINYLCHOLINE CHLORIDE 200 MG/10ML IV SOSY
PREFILLED_SYRINGE | INTRAVENOUS | Status: AC
  Filled 2019-11-23: qty 10

## 2019-11-23 MED ORDER — PHENYLEPHRINE 40 MCG/ML (10ML) SYRINGE FOR IV PUSH (FOR BLOOD PRESSURE SUPPORT)
PREFILLED_SYRINGE | INTRAVENOUS | Status: AC
  Filled 2019-11-23: qty 10

## 2019-11-23 MED ORDER — FENTANYL CITRATE (PF) 100 MCG/2ML IJ SOLN
INTRAMUSCULAR | Status: AC
  Filled 2019-11-23: qty 2

## 2019-11-23 MED ORDER — BUPIVACAINE HCL (PF) 0.25 % IJ SOLN
INTRAMUSCULAR | Status: DC | PRN
  Administered 2019-11-23: 10 mL

## 2019-11-23 MED ORDER — ONDANSETRON HCL 4 MG/2ML IJ SOLN
INTRAMUSCULAR | Status: AC
  Filled 2019-11-23: qty 2

## 2019-11-23 MED ORDER — ONDANSETRON HCL 4 MG/2ML IJ SOLN: 4.0000 mg | Freq: Once | INTRAMUSCULAR | Status: DC | PRN

## 2019-11-23 MED ORDER — METRONIDAZOLE IN NACL 5-0.79 MG/ML-% IV SOLN
500.0000 mg | Freq: Three times a day (TID) | INTRAVENOUS | Status: DC
  Filled 2019-11-23 (×2): qty 100

## 2019-11-23 MED ORDER — METHOCARBAMOL 500 MG PO TABS: 1000.0000 mg | ORAL_TABLET | Freq: Three times a day (TID) | ORAL | Status: DC

## 2019-11-23 MED ORDER — ACETAMINOPHEN 10 MG/ML IV SOLN
INTRAVENOUS | Status: AC
  Filled 2019-11-23: qty 100

## 2019-11-23 MED ORDER — FENTANYL CITRATE (PF) 250 MCG/5ML IJ SOLN
INTRAMUSCULAR | Status: DC | PRN
  Administered 2019-11-23 (×2): 50 ug via INTRAVENOUS
  Administered 2019-11-23: 100 ug via INTRAVENOUS
  Administered 2019-11-23: 50 ug via INTRAVENOUS

## 2019-11-23 MED ORDER — CEFAZOLIN SODIUM-DEXTROSE 2-3 GM-%(50ML) IV SOLR
INTRAVENOUS | Status: DC | PRN
  Administered 2019-11-23: 2 g via INTRAVENOUS

## 2019-11-23 MED ORDER — GLYCOPYRROLATE 0.2 MG/ML IJ SOLN
INTRAMUSCULAR | Status: DC | PRN
  Administered 2019-11-23: .4 mg via INTRAVENOUS

## 2019-11-23 MED ORDER — 0.9 % SODIUM CHLORIDE (POUR BTL) OPTIME
TOPICAL | Status: DC | PRN
  Administered 2019-11-23: 1000 mL

## 2019-11-23 MED ORDER — FENTANYL CITRATE (PF) 250 MCG/5ML IJ SOLN
INTRAMUSCULAR | Status: AC
  Filled 2019-11-23: qty 5

## 2019-11-23 MED ORDER — LIDOCAINE 2% (20 MG/ML) 5 ML SYRINGE
INTRAMUSCULAR | Status: DC | PRN
  Administered 2019-11-23: 40 mg via INTRAVENOUS

## 2019-11-23 MED ORDER — NIFEDIPINE ER OSMOTIC RELEASE 30 MG PO TB24
30.0000 mg | ORAL_TABLET | Freq: Once | ORAL | Status: AC
  Administered 2019-11-23: 30 mg via ORAL
  Filled 2019-11-23: qty 1

## 2019-11-23 MED ORDER — BUPIVACAINE HCL (PF) 0.25 % IJ SOLN
INTRAMUSCULAR | Status: AC
  Filled 2019-11-23: qty 30

## 2019-11-23 MED ORDER — LACTATED RINGERS IV BOLUS
1000.0000 mL | Freq: Once | INTRAVENOUS | Status: AC
  Administered 2019-11-23: 1000 mL via INTRAVENOUS

## 2019-11-23 MED ORDER — ROCURONIUM BROMIDE 10 MG/ML (PF) SYRINGE
PREFILLED_SYRINGE | INTRAVENOUS | Status: DC | PRN
  Administered 2019-11-23: 40 mg via INTRAVENOUS

## 2019-11-23 MED ORDER — GLYCOPYRROLATE PF 0.2 MG/ML IJ SOSY
PREFILLED_SYRINGE | INTRAMUSCULAR | Status: AC
  Filled 2019-11-23: qty 1

## 2019-11-23 MED ORDER — TERBUTALINE SULFATE 1 MG/ML IJ SOLN
INTRAMUSCULAR | Status: AC
  Administered 2019-11-23: 20:00:00 0.25 mg via SUBCUTANEOUS
  Filled 2019-11-23: qty 1

## 2019-11-23 MED ORDER — HYDROMORPHONE HCL 1 MG/ML IJ SOLN
1.0000 mg | INTRAMUSCULAR | Status: DC | PRN
  Administered 2019-11-23 – 2019-11-24 (×3): 1 mg via INTRAVENOUS
  Filled 2019-11-23 (×3): qty 1

## 2019-11-23 MED ORDER — SODIUM CHLORIDE 0.9 % IR SOLN
Status: DC | PRN
  Administered 2019-11-23: 1000 mL

## 2019-11-23 MED ORDER — LACTATED RINGERS IV BOLUS
500.0000 mL | Freq: Once | INTRAVENOUS | Status: AC
  Administered 2019-11-23: 500 mL via INTRAVENOUS

## 2019-11-23 MED ORDER — ROCURONIUM BROMIDE 10 MG/ML (PF) SYRINGE
PREFILLED_SYRINGE | INTRAVENOUS | Status: AC
  Filled 2019-11-23: qty 10

## 2019-11-23 MED ORDER — SUCCINYLCHOLINE CHLORIDE 20 MG/ML IJ SOLN
INTRAMUSCULAR | Status: DC | PRN
  Administered 2019-11-23: 100 mg via INTRAVENOUS

## 2019-11-23 MED ORDER — PHENYLEPHRINE 40 MCG/ML (10ML) SYRINGE FOR IV PUSH (FOR BLOOD PRESSURE SUPPORT)
PREFILLED_SYRINGE | INTRAVENOUS | Status: DC | PRN
  Administered 2019-11-23 (×5): 40 ug via INTRAVENOUS

## 2019-11-23 SURGICAL SUPPLY — 50 items
ADH SKN CLS LQ APL DERMABOND (GAUZE/BANDAGES/DRESSINGS) ×1
APL PRP STRL LF DISP 70% ISPRP (MISCELLANEOUS) ×1
APPLIER CLIP 5 13 M/L LIGAMAX5 (MISCELLANEOUS) ×3
APPLIER CLIP ROT 10 11.4 M/L (STAPLE)
APR CLP MED LRG 11.4X10 (STAPLE)
APR CLP MED LRG 5 ANG JAW (MISCELLANEOUS) ×1
BAG SPEC RTRVL LRG 6X4 10 (ENDOMECHANICALS) ×1
BLADE CLIPPER SURG (BLADE) IMPLANT
CANISTER SUCT 3000ML PPV (MISCELLANEOUS) IMPLANT
CHLORAPREP W/TINT 26 (MISCELLANEOUS) ×3 IMPLANT
CLIP APPLIE 5 13 M/L LIGAMAX5 (MISCELLANEOUS) ×1 IMPLANT
CLIP APPLIE ROT 10 11.4 M/L (STAPLE) IMPLANT
COVER SURGICAL LIGHT HANDLE (MISCELLANEOUS) ×3 IMPLANT
COVER WAND RF STERILE (DRAPES) ×3 IMPLANT
CUTTER FLEX LINEAR 45M (STAPLE) ×3 IMPLANT
DERMABOND ADHESIVE PROPEN (GAUZE/BANDAGES/DRESSINGS) ×2
DERMABOND ADVANCED .7 DNX6 (GAUZE/BANDAGES/DRESSINGS) ×1 IMPLANT
ELECT CAUTERY BLADE 6.4 (BLADE) ×3 IMPLANT
ELECT REM PT RETURN 9FT ADLT (ELECTROSURGICAL) ×3
ELECTRODE REM PT RTRN 9FT ADLT (ELECTROSURGICAL) ×1 IMPLANT
ENDOLOOP SUT PDS II  0 18 (SUTURE)
ENDOLOOP SUT PDS II 0 18 (SUTURE) IMPLANT
GLOVE BIO SURGEON STRL SZ 6.5 (GLOVE) ×2 IMPLANT
GLOVE BIO SURGEONS STRL SZ 6.5 (GLOVE) ×1
GLOVE BIOGEL PI IND STRL 6 (GLOVE) ×1 IMPLANT
GLOVE BIOGEL PI INDICATOR 6 (GLOVE) ×2
GOWN STRL REUS W/ TWL LRG LVL3 (GOWN DISPOSABLE) ×3 IMPLANT
GOWN STRL REUS W/TWL LRG LVL3 (GOWN DISPOSABLE) ×9
KIT BASIN OR (CUSTOM PROCEDURE TRAY) ×3 IMPLANT
KIT TURNOVER KIT B (KITS) ×3 IMPLANT
NS IRRIG 1000ML POUR BTL (IV SOLUTION) ×3 IMPLANT
PAD ARMBOARD 7.5X6 YLW CONV (MISCELLANEOUS) ×6 IMPLANT
PENCIL BUTTON HOLSTER BLD 10FT (ELECTRODE) ×3 IMPLANT
POUCH SPECIMEN RETRIEVAL 10MM (ENDOMECHANICALS) ×3 IMPLANT
RELOAD 45 VASCULAR/THIN (ENDOMECHANICALS) ×3 IMPLANT
RELOAD STAPLE TA45 3.5 REG BLU (ENDOMECHANICALS) ×3 IMPLANT
SCISSORS LAP 5X35 DISP (ENDOMECHANICALS) IMPLANT
SET IRRIG TUBING LAPAROSCOPIC (IRRIGATION / IRRIGATOR) ×3 IMPLANT
SET TUBE SMOKE EVAC HIGH FLOW (TUBING) ×3 IMPLANT
SHEARS HARMONIC ACE PLUS 36CM (ENDOMECHANICALS) IMPLANT
SLEEVE ENDOPATH XCEL 5M (ENDOMECHANICALS) ×3 IMPLANT
SPECIMEN JAR SMALL (MISCELLANEOUS) ×3 IMPLANT
SUT MNCRL AB 4-0 PS2 18 (SUTURE) ×3 IMPLANT
SUT VICRYL 0 UR6 27IN ABS (SUTURE) IMPLANT
TOWEL GREEN STERILE (TOWEL DISPOSABLE) ×3 IMPLANT
TOWEL GREEN STERILE FF (TOWEL DISPOSABLE) ×3 IMPLANT
TRAY LAPAROSCOPIC MC (CUSTOM PROCEDURE TRAY) ×3 IMPLANT
TROCAR XCEL BLUNT TIP 100MML (ENDOMECHANICALS) ×3 IMPLANT
TROCAR XCEL NON-BLD 5MMX100MML (ENDOMECHANICALS) ×3 IMPLANT
WATER STERILE IRR 1000ML POUR (IV SOLUTION) ×3 IMPLANT

## 2019-11-23 NOTE — Anesthesia Procedure Notes (Signed)
Procedure Name: Intubation Date/Time: 11/23/2019 5:51 PM Performed by: Amadeo Garnet, CRNA Pre-anesthesia Checklist: Patient identified, Emergency Drugs available, Suction available, Patient being monitored and Timeout performed Patient Re-evaluated:Patient Re-evaluated prior to induction Oxygen Delivery Method: Circle system utilized Preoxygenation: Pre-oxygenation with 100% oxygen Induction Type: IV induction and Rapid sequence Laryngoscope Size: Mac and 3 Grade View: Grade I Tube type: Oral Tube size: 7.0 mm Number of attempts: 1 Airway Equipment and Method: Stylet Placement Confirmation: ETT inserted through vocal cords under direct vision,  positive ETCO2 and breath sounds checked- equal and bilateral Secured at: 21 cm Tube secured with: Tape Dental Injury: Teeth and Oropharynx as per pre-operative assessment

## 2019-11-23 NOTE — Anesthesia Preprocedure Evaluation (Signed)
Anesthesia Evaluation  Patient identified by MRN, date of birth, ID band Patient awake    Reviewed: Allergy & Precautions, NPO status , Patient's Chart, lab work & pertinent test results  Airway Mallampati: II  TM Distance: >3 FB Neck ROM: Full    Dental  (+) Teeth Intact, Dental Advisory Given   Pulmonary    breath sounds clear to auscultation       Cardiovascular  Rhythm:Regular Rate:Normal     Neuro/Psych    GI/Hepatic   Endo/Other    Renal/GU      Musculoskeletal   Abdominal   Peds  Hematology   Anesthesia Other Findings   Reproductive/Obstetrics                             Anesthesia Physical Anesthesia Plan  ASA: II and emergent  Anesthesia Plan: General   Post-op Pain Management:    Induction: Rapid sequence and Cricoid pressure planned  PONV Risk Score and Plan: Ondansetron  Airway Management Planned: Oral ETT  Additional Equipment:   Intra-op Plan:   Post-operative Plan: Extubation in OR  Informed Consent: I have reviewed the patients History and Physical, chart, labs and discussed the procedure including the risks, benefits and alternatives for the proposed anesthesia with the patient or authorized representative who has indicated his/her understanding and acceptance.     Dental advisory given  Plan Discussed with: CRNA and Anesthesiologist  Anesthesia Plan Comments:         Anesthesia Quick Evaluation

## 2019-11-23 NOTE — Progress Notes (Signed)
OBRR RN in PACU evaluating patient post lap. Appendectomy. FHR and TOCO monitors applied for NST.

## 2019-11-23 NOTE — Op Note (Signed)
   Operative Note   Date: 11/23/2019  Procedure: laparoscopic appendectomy  Pre-op diagnosis: acute appendicitis Post-op diagnosis: Grade 1a appendicitis: mildly inflamed appendix  Indication and clinical history: The patient is a 34 y.o. year old female with early acute appendicitis    Surgeon: Diamantina Monks, MD Assistant: Andrey Campanile, MD  Anesthesiologist: Noreene Larsson, MD Anesthesia: General  Findings:  . Specimen: appendix . EBL: <5cc . Drains/Implants: none  Disposition: PACU - hemodynamically stable.  Description of procedure: The patient was positioned supine on the operating room table. Time-out was performed verifying correct patient, procedure, signature of informed consent, and administration of pre-operative antibiotics. General anesthetic induction and intubation were uneventful. The patient was repositioned to a partial left lateral decubitus. Foley catheter insertion was not performed as patient voided immediately prior to the procedure. The abdomen was prepped and draped in the usual sterile fashion. A midline incision was made three fingerbreadths above the fundus of the uterus and the abdominal cavity was entered using an open technique using zero vicryl stay sutures on either side of the fascia and a 71mm Hassan port inserted. After establishing low pressure pneumoperitoneum to , which the patient tolerated well, the abdominal cavity was inspected and no injury of any intra-abdominal structures was identified. Two additional five millimeter ports were placed under direct visualization and using local anesthetic in the right upper and right lower quadrant regions. The patient was repositioned to Trendelenburg with the left side down. Further inspection of the right lower quadrant revealed a minimal amount of serous fluid and grade 1a appendicitis. Adhesiolysis was performed for approximately 15 minutes during dissection, mobilization, and identification of the appendix. The  appendix was dissected away from its mesoappendix and an endoscopic stapler used to divide the mesoappendix using a vascular load. A bowel load of the endoscopic stapler was used to staple across the appendix at its base. Both staple lines were inspected and found to be intact, but there was some bleeding from the medial edge of the vascular staple line with was controlled with endoscopic clipping. The appendix was placed in an endoscopic specimen retrieval bag, removed via the umbilical port site, and sent to pathology as a permanent specimen. The abdomen was then re-insufflated and the staple line confirmed to be hemostatic. A drain was not placed. The fascia was re-approximated using the stay sutures. The skin of all port sites was closed with 4-0 monocryl. Sterile dressings were applied. All sponge and instrument counts were correct at the conclusion of the procedure. The patient was awakened from anesthesia, extubated uneventfully, and transported to the PACU in good condition. There were no complications.   FOB, Michaelyn Barter, notified via phone post-procedure.   Diamantina Monks, MD General and Trauma Surgery [facility]

## 2019-11-23 NOTE — Anesthesia Postprocedure Evaluation (Signed)
Anesthesia Post Note  Patient: Kaylee Gibson  Procedure(s) Performed: APPENDECTOMY LAPAROSCOPIC (N/A Abdomen)     Patient location during evaluation: PACU Anesthesia Type: General Level of consciousness: awake and alert Pain management: pain level controlled Vital Signs Assessment: post-procedure vital signs reviewed and stable Respiratory status: spontaneous breathing, nonlabored ventilation, respiratory function stable and patient connected to nasal cannula oxygen Cardiovascular status: blood pressure returned to baseline and stable Postop Assessment: no apparent nausea or vomiting Anesthetic complications: no    Last Vitals:  Vitals:   11/23/19 2010 11/23/19 2015  BP:    Pulse:    Resp:    Temp:    SpO2: 98% 100%    Last Pain:  Vitals:   11/23/19 2002  TempSrc: Oral  PainSc:                  Eliza Grissinger S

## 2019-11-23 NOTE — Plan of Care (Signed)
  Problem: Education: Goal: Knowledge of General Education information will improve Description: Including pain rating scale, medication(s)/side effects and non-pharmacologic comfort measures Outcome: Completed/Met

## 2019-11-23 NOTE — MAU Provider Note (Signed)
Chief Complaint:  Abdominal Pain   None     HPI: Kaylee Gibson is a 34 y.o. G1P0 at [redacted]w[redacted]d who presents to maternity admissions reporting sudden onset of RLQ abdominal pain that woke her up this morning.  The pain is constant but worsens with walking/movement.  There are no associated symptoms, she denies n/v or fever/chills.  . She reports good fetal movement, denies LOF, vaginal bleeding, vaginal itching/burning, urinary symptoms, h/a, dizziness, n/v, or fever/chills.    HPI  Past Medical History: Past Medical History:  Diagnosis Date  . Eczema   . Medical history non-contributory     Past obstetric history: OB History  Gravida Para Term Preterm AB Living  1         0  SAB TAB Ectopic Multiple Live Births               # Outcome Date GA Lbr Len/2nd Weight Sex Delivery Anes PTL Lv  1 Current             Past Surgical History: Past Surgical History:  Procedure Laterality Date  . NO PAST SURGERIES      Family History: Family History  Problem Relation Age of Onset  . Asthma Mother   . Asthma Father   . Brain cancer Brother        Neuroblastoma   . Diabetes Maternal Aunt     Social History: Social History   Tobacco Use  . Smoking status: Never Smoker  . Smokeless tobacco: Never Used  Substance Use Topics  . Alcohol use: No    Alcohol/week: 0.0 standard drinks  . Drug use: No    Allergies:  Allergies  Allergen Reactions  . Penicillins Rash    Meds:  Medications Prior to Admission  Medication Sig Dispense Refill Last Dose  . Prenatal Vit-Fe Fumarate-FA (PRENATAL VITAMIN) 27-0.8 MG TABS Take 1 Dose by mouth daily. 90 tablet 2 11/23/2019 at Unknown time    ROS:  Review of Systems  Constitutional: Negative for chills, fatigue and fever.  Eyes: Negative for visual disturbance.  Respiratory: Negative for shortness of breath.   Cardiovascular: Negative for chest pain.  Gastrointestinal: Positive for abdominal pain. Negative for nausea and vomiting.   Genitourinary: Negative for difficulty urinating, dysuria, flank pain, pelvic pain, vaginal bleeding, vaginal discharge and vaginal pain.  Neurological: Negative for dizziness and headaches.  Psychiatric/Behavioral: Negative.      I have reviewed patient's Past Medical Hx, Surgical Hx, Family Hx, Social Hx, medications and allergies.   Physical Exam   Patient Vitals for the past 24 hrs:  BP Temp Temp src Pulse Resp SpO2 Height Weight  11/23/19 2100 -- -- -- -- -- 99 % -- --  11/23/19 2059 117/70 -- -- (!) 110 16 -- -- --  11/23/19 2055 -- -- -- -- -- 99 % -- --  11/23/19 2051 -- -- -- -- -- 100 % -- --  11/23/19 2045 -- -- -- -- -- 99 % -- --  11/23/19 2040 -- -- -- -- -- 99 % -- --  11/23/19 2035 -- -- -- -- -- 98 % -- --  11/23/19 2030 -- -- -- -- -- 98 % -- --  11/23/19 2025 -- -- -- -- -- 100 % -- --  11/23/19 2020 -- -- -- -- -- 100 % -- --  11/23/19 2015 -- -- -- -- -- 100 % -- --  11/23/19 2010 -- -- -- -- -- 98 % -- --  11/23/19 2005 -- -- -- -- -- 100 % -- --  11/23/19 2002 126/77 97.8 F (36.6 C) Oral (!) 101 17 -- -- --  11/23/19 2000 -- -- -- -- -- 99 % -- --  11/23/19 1936 129/80 97.8 F (36.6 C) -- 91 14 99 % -- --  11/23/19 1933 -- -- -- -- -- 97 % -- --  11/23/19 1932 -- -- -- -- -- 99 % -- --  11/23/19 1931 -- -- -- -- -- 97 % -- --  11/23/19 1930 132/79 -- -- 96 17 96 % -- --  11/23/19 1928 -- -- -- -- -- 96 % -- --  11/23/19 1927 -- -- -- -- -- 99 % -- --  11/23/19 1926 -- -- -- -- -- 100 % -- --  11/23/19 1925 -- -- -- -- -- 99 % -- --  11/23/19 1924 -- -- -- -- -- 100 % -- --  11/23/19 1923 -- -- -- -- -- 100 % -- --  11/23/19 1922 -- -- -- -- -- 100 % -- --  11/23/19 1921 -- -- -- -- -- 100 % -- --  11/23/19 1920 -- -- -- -- -- 100 % -- --  11/23/19 1919 -- -- -- -- -- 100 % -- --  11/23/19 1918 -- -- -- -- -- 100 % -- --  11/23/19 1915 137/83 -- -- 93 19 99 % -- --  11/23/19 1900 135/90 97.7 F (36.5 C) Oral 90 (!) 21 100 % -- --  11/23/19 1710  -- -- -- -- -- -- 5' 0.98" (1.549 m) 50 kg  11/23/19 1650 -- -- -- -- -- 100 % -- --  11/23/19 1646 111/70 98.1 F (36.7 C) Oral 99 16 -- -- --  11/23/19 1620 -- -- -- -- -- 100 % -- --  11/23/19 1615 -- -- -- -- -- 100 % -- --  11/23/19 1610 -- -- -- -- -- 100 % -- --  11/23/19 1100 -- -- -- -- -- 100 % -- --  11/23/19 1055 -- -- -- -- -- 99 % -- --  11/23/19 1050 -- -- -- -- -- 99 % -- --  11/23/19 1045 -- -- -- -- -- 99 % -- --  11/23/19 1040 -- -- -- -- -- 99 % -- --  11/23/19 1035 -- -- -- -- -- 100 % -- --  11/23/19 1030 -- -- -- -- -- 100 % -- --  11/23/19 1025 -- -- -- -- -- 100 % -- --  11/23/19 1020 -- -- -- -- -- 100 % -- --  11/23/19 1015 -- -- -- -- -- 99 % -- --  11/23/19 1013 109/77 -- -- (!) 104 -- -- -- --  11/23/19 1010 -- -- -- -- -- 100 % -- --  11/23/19 1009 -- 97.7 F (36.5 C) Oral -- -- -- -- --  11/23/19 1005 -- -- -- -- -- 100 % -- --  11/23/19 1000 108/69 -- -- (!) 107 16 100 % -- --   Constitutional: Well-developed, well-nourished female in no acute distress.  Cardiovascular: normal rate Respiratory: normal effort GI: Abd soft, non-tender, gravid appropriate for gestational age.  MS: Extremities nontender, no edema, normal ROM Neurologic: Alert and oriented x 4.  GU: Neg CVAT.  Dilation: Closed Effacement (%): Thick Cervical Position: Posterior Exam by:: Sharen Counter CNM  FHT:  Baseline 145 , moderate variability, accelerations present, no decelerations Contractions: None on toco or to palpation  Labs: Results for orders placed or performed during the hospital encounter of 11/23/19 (from the past 24 hour(s))  Urinalysis, Routine w reflex microscopic     Status: Abnormal   Collection Time: 11/23/19 10:01 AM  Result Value Ref Range   Color, Urine YELLOW YELLOW   APPearance HAZY (A) CLEAR   Specific Gravity, Urine 1.011 1.005 - 1.030   pH 7.0 5.0 - 8.0   Glucose, UA NEGATIVE NEGATIVE mg/dL   Hgb urine dipstick NEGATIVE NEGATIVE    Bilirubin Urine NEGATIVE NEGATIVE   Ketones, ur NEGATIVE NEGATIVE mg/dL   Protein, ur NEGATIVE NEGATIVE mg/dL   Nitrite NEGATIVE NEGATIVE   Leukocytes,Ua LARGE (A) NEGATIVE   RBC / HPF 0-5 0 - 5 RBC/hpf   WBC, UA 6-10 0 - 5 WBC/hpf   Bacteria, UA RARE (A) NONE SEEN   Squamous Epithelial / LPF 6-10 0 - 5   Mucus PRESENT    Amorphous Crystal PRESENT   CBC with Differential/Platelet     Status: Abnormal   Collection Time: 11/23/19 12:24 PM  Result Value Ref Range   WBC 7.3 4.0 - 10.5 K/uL   RBC 3.09 (L) 3.87 - 5.11 MIL/uL   Hemoglobin 9.1 (L) 12.0 - 15.0 g/dL   HCT 28.9 (L) 36.0 - 46.0 %   MCV 93.5 80.0 - 100.0 fL   MCH 29.4 26.0 - 34.0 pg   MCHC 31.5 30.0 - 36.0 g/dL   RDW 12.3 11.5 - 15.5 %   Platelets 361 150 - 400 K/uL   nRBC 0.0 0.0 - 0.2 %   Neutrophils Relative % 76 %   Neutro Abs 5.6 1.7 - 7.7 K/uL   Lymphocytes Relative 15 %   Lymphs Abs 1.1 0.7 - 4.0 K/uL   Monocytes Relative 6 %   Monocytes Absolute 0.5 0.1 - 1.0 K/uL   Eosinophils Relative 1 %   Eosinophils Absolute 0.1 0.0 - 0.5 K/uL   Basophils Relative 0 %   Basophils Absolute 0.0 0.0 - 0.1 K/uL   Immature Granulocytes 2 %   Abs Immature Granulocytes 0.14 (H) 0.00 - 0.07 K/uL  Respiratory Panel by RT PCR (Flu A&B, Covid) - Nasopharyngeal Swab     Status: None   Collection Time: 11/23/19  3:36 PM   Specimen: Nasopharyngeal Swab  Result Value Ref Range   SARS Coronavirus 2 by RT PCR NEGATIVE NEGATIVE   Influenza A by PCR NEGATIVE NEGATIVE   Influenza B by PCR NEGATIVE NEGATIVE  Type and screen [place]     Status: None   Collection Time: 11/23/19  3:50 PM  Result Value Ref Range   ABO/RH(D) O POS    Antibody Screen NEG    Sample Expiration      11/26/2019,2359 Performed at [facility], [address], [place], [place] [address]   ABO/Rh     Status: None   Collection Time: 11/23/19  3:50 PM  Result Value Ref Range   ABO/RH(D)      O POS Performed at [facility] [address] [place], [place] [address]    --/--/O POS, Jenetta Downer POS Performed at [facility], [place] [address], [place],  [address]  [phone])  Imaging:  MR PELVIS WO CONTRAST  Result Date: 11/23/2019 CLINICAL DATA:  Right lower quadrant abdominal pain.  Pregnancy. EXAM: MRI ABDOMEN AND PELVIS WITHOUT CONTRAST TECHNIQUE: Multiplanar multisequence MR imaging of the abdomen and pelvis was performed. No intravenous contrast was administered.  COMPARISON:  None. FINDINGS: COMBINED FINDINGS FOR BOTH MR ABDOMEN AND PELVIS Lower chest: Unremarkable Hepatobiliary: Unremarkable Pancreas:  Unremarkable Spleen:  Unremarkable Adrenals/Urinary Tract: Unremarkable. No hydronephrosis or hydroureter. Stomach/Bowel: A tubular structure believed to represent the appendix measures 0.8 cm in diameter, mildly thickened. No surrounding fluid signal or abscess. No obvious surrounding inflammatory stranding. No dilated bowel. Vascular/Lymphatic:  Unremarkable Reproductive: Single intrauterine pregnancy, breech presentation, left posterior placenta without previa. No specific fetal abnormality is identified, but please note that today's exam not a dedicated fetal MRI. Other:  No supplemental non-categorized findings. Musculoskeletal: Unremarkable IMPRESSION: 1. The appendix measures 0.8 cm in diameter which is mildly abnormally thickened suspicious for early acute appendicitis. However, there is no surrounding inflammatory stranding or fluid. 2. Single intrauterine pregnancy in breech presentation. Electronically Signed   By: Gaylyn Rong M.D.   On: 11/23/2019 15:15   MR ABDOMEN WO CONTRAST  Result Date: 11/23/2019 CLINICAL DATA:  Right lower quadrant abdominal pain.  Pregnancy. EXAM: MRI ABDOMEN AND PELVIS WITHOUT CONTRAST TECHNIQUE: Multiplanar multisequence MR imaging of the abdomen and pelvis was performed. No intravenous contrast was administered. COMPARISON:  None. FINDINGS: COMBINED FINDINGS FOR BOTH MR  ABDOMEN AND PELVIS Lower chest: Unremarkable Hepatobiliary: Unremarkable Pancreas:  Unremarkable Spleen:  Unremarkable Adrenals/Urinary Tract: Unremarkable. No hydronephrosis or hydroureter. Stomach/Bowel: A tubular structure believed to represent the appendix measures 0.8 cm in diameter, mildly thickened. No surrounding fluid signal or abscess. No obvious surrounding inflammatory stranding. No dilated bowel. Vascular/Lymphatic:  Unremarkable Reproductive: Single intrauterine pregnancy, breech presentation, left posterior placenta without previa. No specific fetal abnormality is identified, but please note that today's exam not a dedicated fetal MRI. Other:  No supplemental non-categorized findings. Musculoskeletal: Unremarkable IMPRESSION: 1. The appendix measures 0.8 cm in diameter which is mildly abnormally thickened suspicious for early acute appendicitis. However, there is no surrounding inflammatory stranding or fluid. 2. Single intrauterine pregnancy in breech presentation. Electronically Signed   By: Gaylyn Rong M.D.   On: 11/23/2019 15:15    MAU Course/MDM: Orders Placed This Encounter  Procedures  . Respiratory Panel by RT PCR (Flu A&B, Covid) - Nasopharyngeal Swab  . MR PELVIS WO CONTRAST  . MR ABDOMEN WO CONTRAST  . Urinalysis, Routine w reflex microscopic  . CBC with Differential/Platelet  . Diet regular Room service appropriate? Yes; Fluid consistency: Thin  . Fetal monitoring  . Type and screen [facility]  . ABO/Rh  . Admit to Inpatient (patient's expected length of stay will be greater than 2 midnights or inpatient only procedure)    Meds ordered this encounter  Medications  . lactated ringers bolus 1,000 mL  . DISCONTD: cefTRIAXone (ROCEPHIN) 2 g in sodium chloride 0.9 % 100 mL IVPB    Order Specific Question:   Antibiotic Indication:    Answer:   Intra-abdominal  . DISCONTD: metroNIDAZOLE (FLAGYL) IVPB 500 mg    Order Specific Question:   Antibiotic  Indication:    Answer:   Intra-abdominal Infection  . DISCONTD: lactated ringers infusion  . DISCONTD: fentaNYL (SUBLIMAZE) injection 25-50 mcg  . DISCONTD: ondansetron (ZOFRAN) injection 4 mg  . DISCONTD: 0.9 % irrigation (POUR BTL)  . DISCONTD: bupivacaine (PF) (MARCAINE) 0.25 % injection  . DISCONTD: sodium chloride irrigation 0.9 %  . oxyCODONE (ROXICODONE) 5 MG/5ML solution 5-10 mg  . acetaminophen (TYLENOL) tablet 1,000 mg  . DISCONTD: methocarbamol (ROBAXIN) tablet 1,000 mg  . fentaNYL (SUBLIMAZE) 100 MCG/2ML injection    Figgatt, Christopher: cabinet override  . NIFEdipine (PROCARDIA-XL/NIFEDICAL-XL)  24 hr tablet 30 mg  . terbutaline (BRETHINE) injection 0.25 mg  . terbutaline (BRETHINE) 1 MG/ML injection    Poore, Elizabeth   : cabinet override  . lactated ringers bolus 500 mL  . HYDROmorphone (DILAUDID) injection 1 mg     NST reviewed and appropriate for gestational age Abdomen not acute but pt with significant tenderness in RL abdomen with acute onset Consult Dr Macon Large, pt to MRI for further evaluation Appendicitis seen on MRI, admit to general surgery Pt stable at time of transfer  Assessment: 1. Acute appendicitis, unspecified acute appendicitis type     Plan: Admit to general surgery   Sharen Counter Certified Nurse-Midwife 11/23/2019 10:40 PM

## 2019-11-23 NOTE — MAU Provider Note (Signed)
  History     CSN: [id number]  Arrival date and time: 11/23/19 0948   None     Chief Complaint  Patient presents with  . Abdominal Pain    Kaylee Gibson is a 34 y.o. G1P0 who presents with complaints of sudden onset right lower quadrant abdominal pain. Patient reports onset as this morning with intensity of 10/10, and gradual reduction to 8/10 at current. Patient states that pain is constant, and aggravated by movement. Patient reports tolerating p.o. Last period of sexual intercourse reported as two days ago, with no significant pain. Patient denies pain in back or any other location, headache, nausea or vomiting, diarrhea, constipation, vaginal bleeding, dysuria, and hematuria.        OB History    Gravida  1   Para      Term      Preterm      AB      Living  0     SAB      TAB      Ectopic      Multiple      Live Births              Past Medical History:  Diagnosis Date  . Eczema   . Medical history non-contributory     Past Surgical History:  Procedure Laterality Date  . NO PAST SURGERIES      Family History  Problem Relation Age of Onset  . Asthma Mother   . Asthma Father   . Brain cancer Brother        Neuroblastoma   . Diabetes Maternal Aunt     Social History   Tobacco Use  . Smoking status: Never Smoker  . Smokeless tobacco: Never Used  Substance Use Topics  . Alcohol use: No    Alcohol/week: 0.0 standard drinks  . Drug use: No    Allergies:  Allergies  Allergen Reactions  . Penicillins Rash    Medications Prior to Admission  Medication Sig Dispense Refill Last Dose  . Prenatal Vit-Fe Fumarate-FA (PRENATAL VITAMIN) 27-0.8 MG TABS Take 1 Dose by mouth daily. 90 tablet 2 11/23/2019 at Unknown time    Review of Systems  Constitutional: Negative for appetite change. Activity change: limited ambulation due to pain.  Gastrointestinal: Positive for abdominal pain (Right lower quadrant). Negative for anal bleeding, blood in  stool, constipation, diarrhea, nausea and vomiting.  Genitourinary: Negative for difficulty urinating, dyspareunia, dysuria, flank pain, hematuria and vaginal bleeding.  Musculoskeletal: Negative for back pain.  Neurological: Negative for headaches.   Physical Exam   Blood pressure 109/77, pulse (!) 104, temperature 97.7 F (36.5 C), temperature source Oral, resp. rate 16, last menstrual period 05/09/2019, SpO2 100 %.  Physical Exam  Constitutional: She is oriented to person, place, and time. She appears well-developed. No distress.  Respiratory: Effort normal and breath sounds normal.  GI: There is abdominal tenderness (right lower quadrant). There is no rebound.  Neurological: She is alert and oriented to person, place, and time.  Skin: She is not diaphoretic.  Psychiatric: She has a normal mood and affect.    MAU Course  Procedures  MDM No evidence of pre-term labor with 0.5 cm/ thick cervix on exam by RN.   Assessment and Plan  Assessment 1. Abdominal pain during pregnancy in third trimester 2. Appendicitis   Scheduled MRI without contrast  Letty Salvi Eulis Manly 11/23/2019, 11:47 AM

## 2019-11-23 NOTE — Transfer of Care (Signed)
Immediate Anesthesia Transfer of Care Note  Patient: Kaylee Gibson  Procedure(s) Performed: APPENDECTOMY LAPAROSCOPIC (N/A Abdomen)  Patient Location: PACU  Anesthesia Type:General  Level of Consciousness: awake, alert  and oriented  Airway & Oxygen Therapy: Patient Spontanous Breathing  Post-op Assessment: Report given to RN, Post -op Vital signs reviewed and stable and Patient moving all extremities  Post vital signs: Reviewed and stable  Last Vitals:  Vitals Value Taken Time  BP 135/90 11/23/19 1902  Temp 36.5 C 11/23/19 1900  Pulse 93 11/23/19 1914  Resp 19 11/23/19 1914  SpO2 99 % 11/23/19 1914  Vitals shown include unvalidated device data.  Last Pain:  Vitals:   11/23/19 1900  TempSrc: Oral  PainSc: 10-Worst pain ever         Complications: No apparent anesthesia complications

## 2019-11-23 NOTE — Progress Notes (Signed)
Spoke with Dr. Marice Potter. Pt does not need an NST since she was monitored in MAU. Three min of tracing obtained before pt taken to surgery. FHR baseline 145 BPM, min variability, no accels, no decels. No vaginal bleeding or leaking of fluid.

## 2019-11-23 NOTE — MAU Note (Signed)
abd pain woke her this morning, is sharp and constant.

## 2019-11-23 NOTE — Consult Note (Signed)
Reason for Consult/Chief Complaint: acute appendicitis Consultant: Jerrol Banana, MD   Kaylee Gibson is an 34 y.o. female.   HPI: 52F G1P0 p/w acute onset RLQ abdominal pain that began this morning. No associated symptoms of nausea, vomiting, fever, chills, diarrhea, or constipation.  She reports improvement of her symptoms with pain medication and movement and ambulation as aggravating symptoms.  Past Medical History:  Diagnosis Date  . Eczema   . Medical history non-contributory     Past Surgical History:  Procedure Laterality Date  . NO PAST SURGERIES      Family History  Problem Relation Age of Onset  . Asthma Mother   . Asthma Father   . Brain cancer Brother        Neuroblastoma   . Diabetes Maternal Aunt     Social History:  reports that she has never smoked. She has never used smokeless tobacco. She reports that she does not drink alcohol or use drugs.  Allergies:  Allergies  Allergen Reactions  . Penicillins Rash    Medications: I have reviewed the patient's current medications.  Results for orders placed or performed during the hospital encounter of 11/23/19 (from the past 48 hour(s))  Urinalysis, Routine w reflex microscopic     Status: Abnormal   Collection Time: 11/23/19 10:01 AM  Result Value Ref Range   Color, Urine YELLOW YELLOW   APPearance HAZY (A) CLEAR   Specific Gravity, Urine 1.011 1.005 - 1.030   pH 7.0 5.0 - 8.0   Glucose, UA NEGATIVE NEGATIVE mg/dL   Hgb urine dipstick NEGATIVE NEGATIVE   Bilirubin Urine NEGATIVE NEGATIVE   Ketones, ur NEGATIVE NEGATIVE mg/dL   Protein, ur NEGATIVE NEGATIVE mg/dL   Nitrite NEGATIVE NEGATIVE   Leukocytes,Ua LARGE (A) NEGATIVE   RBC / HPF 0-5 0 - 5 RBC/hpf   WBC, UA 6-10 0 - 5 WBC/hpf   Bacteria, UA RARE (A) NONE SEEN   Squamous Epithelial / LPF 6-10 0 - 5   Mucus PRESENT    Amorphous Crystal PRESENT     Comment: Performed at [facility], [address], [place], [place] [address]  CBC with  Differential/Platelet     Status: Abnormal   Collection Time: 11/23/19 12:24 PM  Result Value Ref Range   WBC 7.3 4.0 - 10.5 K/uL   RBC 3.09 (L) 3.87 - 5.11 MIL/uL   Hemoglobin 9.1 (L) 12.0 - 15.0 g/dL   HCT 62.8 (L) 31.5 - 17.6 %   MCV 93.5 80.0 - 100.0 fL   MCH 29.4 26.0 - 34.0 pg   MCHC 31.5 30.0 - 36.0 g/dL   RDW 16.0 73.7 - 10.6 %   Platelets 361 150 - 400 K/uL   nRBC 0.0 0.0 - 0.2 %   Neutrophils Relative % 76 %   Neutro Abs 5.6 1.7 - 7.7 K/uL   Lymphocytes Relative 15 %   Lymphs Abs 1.1 0.7 - 4.0 K/uL   Monocytes Relative 6 %   Monocytes Absolute 0.5 0.1 - 1.0 K/uL   Eosinophils Relative 1 %   Eosinophils Absolute 0.1 0.0 - 0.5 K/uL   Basophils Relative 0 %   Basophils Absolute 0.0 0.0 - 0.1 K/uL   Immature Granulocytes 2 %   Abs Immature Granulocytes 0.14 (H) 0.00 - 0.07 K/uL    Comment: Performed at [facility], [address], [place], [place] [address]    MR PELVIS WO CONTRAST  Result Date: 11/23/2019 CLINICAL DATA:  Right lower quadrant abdominal pain.  Pregnancy. EXAM: MRI ABDOMEN AND PELVIS WITHOUT CONTRAST TECHNIQUE: Multiplanar multisequence MR imaging of the abdomen and pelvis was performed. No intravenous contrast was administered. COMPARISON:  None. FINDINGS: COMBINED FINDINGS FOR BOTH MR ABDOMEN AND PELVIS Lower chest: Unremarkable Hepatobiliary: Unremarkable Pancreas:  Unremarkable Spleen:  Unremarkable Adrenals/Urinary Tract: Unremarkable. No hydronephrosis or hydroureter. Stomach/Bowel: A tubular structure believed to represent the appendix measures 0.8 cm in diameter, mildly thickened. No surrounding fluid signal or abscess. No obvious surrounding inflammatory stranding. No dilated bowel. Vascular/Lymphatic:  Unremarkable Reproductive: Single intrauterine pregnancy, breech presentation, left posterior placenta without previa. No specific fetal abnormality is identified, but please note that today's exam not a dedicated fetal MRI. Other:  No supplemental  non-categorized findings. Musculoskeletal: Unremarkable IMPRESSION: 1. The appendix measures 0.8 cm in diameter which is mildly abnormally thickened suspicious for early acute appendicitis. However, there is no surrounding inflammatory stranding or fluid. 2. Single intrauterine pregnancy in breech presentation. Electronically Signed   By: Van Clines M.D.   On: 11/23/2019 15:15   MR ABDOMEN WO CONTRAST  Result Date: 11/23/2019 CLINICAL DATA:  Right lower quadrant abdominal pain.  Pregnancy. EXAM: MRI ABDOMEN AND PELVIS WITHOUT CONTRAST TECHNIQUE: Multiplanar multisequence MR imaging of the abdomen and pelvis was performed. No intravenous contrast was administered. COMPARISON:  None. FINDINGS: COMBINED FINDINGS FOR BOTH MR ABDOMEN AND PELVIS Lower chest: Unremarkable Hepatobiliary: Unremarkable Pancreas:  Unremarkable Spleen:  Unremarkable Adrenals/Urinary Tract: Unremarkable. No hydronephrosis or hydroureter. Stomach/Bowel: A tubular structure believed to represent the appendix measures 0.8 cm in diameter, mildly thickened. No surrounding fluid signal or abscess. No obvious surrounding inflammatory stranding. No dilated bowel. Vascular/Lymphatic:  Unremarkable Reproductive: Single intrauterine pregnancy, breech presentation, left posterior placenta without previa. No specific fetal abnormality is identified, but please note that today's exam not a dedicated fetal MRI. Other:  No supplemental non-categorized findings. Musculoskeletal: Unremarkable IMPRESSION: 1. The appendix measures 0.8 cm in diameter which is mildly abnormally thickened suspicious for early acute appendicitis. However, there is no surrounding inflammatory stranding or fluid. 2. Single intrauterine pregnancy in breech presentation. Electronically Signed   By: Van Clines M.D.   On: 11/23/2019 15:15    ROS 10 point review of systems is negative except as listed above in HPI.   Physical Exam Blood pressure 109/77, pulse (!)  104, temperature 97.7 F (36.5 C), temperature source Oral, resp. rate 16, last menstrual period 05/09/2019, SpO2 100 %. Constitutional: well-developed, well-nourished HEENT: pupils equal, round, reactive to light, 90mm b/l, moist conjunctiva, external inspection of ears and nose normal, hearing intact Oropharynx: normal oropharyngeal mucosa, normal dentition Neck: no thyromegaly, trachea midline Chest: breath sounds equal bilaterally, normal respiratory effort, no midline or lateral chest wall tenderness to palpation/deformity Abdomen: soft, R mid-abdomen TTP, gravid uterus, no bruising, no hepatosplenomegaly Pelvis: stable Extremities: 2+ radial and pedal pulses bilaterally, motor and sensation intact to bilateral UE and LE, no peripheral edema MSK: abnormal gait/station 2/2 pain, no clubbing/cyanosis of fingers/toes, normal ROM of all four extremities Skin: warm, dry, no rashes Psych: normal memory, normal mood/affect    Assessment/Plan: 70F, G1P0, with early acute appendicitis. To OR for lap appy. Informed consent obtained after detailed explanation of risks and benefits, including infection, bleeding, abscess, bowel perforation, uterine injury, premature delivery, and fetal loss, and need for conversion to open procedure. Father of baby participated in conversation and consent via phone. All questions from the patient and her partner were answered to their satisfaction.   FOB, Abelina Bachelor, [phone]  Jesusita Oka, MD General and [facility]

## 2019-11-23 NOTE — Progress Notes (Signed)
Pt. Ctx every 1-2 minutes treated with 0.25mg  terbutaline subcutaneous and will get 30mg  XL procardia once verified by pharmacy. Pt. Also getting LR fluid bolus. If persists will contact OB MD. Report called to RN by [facility] RN. Pt. To be transported on bed by [facility] RN and PACU.

## 2019-11-24 DIAGNOSIS — Z9049 Acquired absence of other specified parts of digestive tract: Secondary | ICD-10-CM

## 2019-11-24 LAB — BASIC METABOLIC PANEL
Anion gap: 11 (ref 5–15)
BUN: 5 mg/dL — ABNORMAL LOW (ref 6–20)
CO2: 23 mmol/L (ref 22–32)
Calcium: 8.5 mg/dL — ABNORMAL LOW (ref 8.9–10.3)
Chloride: 102 mmol/L (ref 98–111)
Creatinine, Ser: 0.64 mg/dL (ref 0.44–1.00)
GFR calc Af Amer: >60 mL/min (ref >60)
GFR calc non Af Amer: >60 mL/min (ref >60)
Glucose, Bld: 108 mg/dL — ABNORMAL HIGH (ref 70–99)
Potassium: 3.7 mmol/L (ref 3.5–5.1)
Sodium: 136 mmol/L (ref 135–145)

## 2019-11-24 LAB — CBC
HCT: 27.9 % — ABNORMAL LOW (ref 36.0–46.0)
Hemoglobin: 8.7 g/dL — ABNORMAL LOW (ref 12.0–15.0)
MCH: 29.3 pg (ref 26.0–34.0)
MCHC: 31.2 g/dL (ref 30.0–36.0)
MCV: 93.9 fL (ref 80.0–100.0)
Platelets: 319 10*3/uL (ref 150–400)
RBC: 2.97 MIL/uL — ABNORMAL LOW (ref 3.87–5.11)
RDW: 12.7 % (ref 11.5–15.5)
WBC: 8.8 10*3/uL (ref 4.0–10.5)
nRBC: 0 % (ref 0.0–0.2)

## 2019-11-24 LAB — MAGNESIUM: Magnesium: 1.5 mg/dL — ABNORMAL LOW (ref 1.7–2.4)

## 2019-11-24 LAB — PHOSPHORUS: Phosphorus: 3.2 mg/dL (ref 2.5–4.6)

## 2019-11-24 MED ORDER — CYCLOBENZAPRINE HCL 10 MG PO TABS: 5.0000 mg | ORAL_TABLET | Freq: Three times a day (TID) | ORAL | Status: DC | PRN

## 2019-11-24 MED ORDER — ONDANSETRON HCL 4 MG PO TABS: 4.0000 mg | ORAL_TABLET | Freq: Three times a day (TID) | ORAL | Status: DC | PRN

## 2019-11-24 MED ORDER — COMPLETENATE 29-1 MG PO CHEW
1.0000 | CHEWABLE_TABLET | Freq: Every day | ORAL | Status: DC
  Administered 2019-11-24: 1 via ORAL
  Filled 2019-11-24: qty 1

## 2019-11-24 MED ORDER — LACTATED RINGERS IV SOLN: INTRAVENOUS | Status: DC

## 2019-11-24 MED ORDER — PROMETHAZINE HCL 25 MG/ML IJ SOLN
25.0000 mg | Freq: Four times a day (QID) | INTRAMUSCULAR | Status: DC | PRN
  Administered 2019-11-24 (×2): 25 mg via INTRAVENOUS
  Filled 2019-11-24 (×2): qty 1

## 2019-11-24 MED ORDER — ENOXAPARIN SODIUM 40 MG/0.4ML ~~LOC~~ SOLN
40.0000 mg | SUBCUTANEOUS | Status: DC
  Administered 2019-11-24 – 2019-11-25 (×2): 40 mg via SUBCUTANEOUS
  Filled 2019-11-24 (×2): qty 0.4

## 2019-11-24 MED ORDER — HYDROMORPHONE HCL 1 MG/ML IJ SOLN
1.0000 mg | INTRAMUSCULAR | Status: DC | PRN
  Administered 2019-11-24 – 2019-11-25 (×3): 1 mg via INTRAVENOUS
  Filled 2019-11-24 (×3): qty 1

## 2019-11-24 MED ORDER — POLYETHYLENE GLYCOL 3350 17 G PO PACK
17.0000 g | PACK | Freq: Every day | ORAL | Status: DC
  Administered 2019-11-25: 17 g via ORAL
  Filled 2019-11-24 (×2): qty 1

## 2019-11-24 MED ORDER — ONDANSETRON HCL 4 MG/2ML IJ SOLN
4.0000 mg | Freq: Four times a day (QID) | INTRAMUSCULAR | Status: DC | PRN
  Administered 2019-11-24: 4 mg via INTRAVENOUS
  Filled 2019-11-24: qty 2

## 2019-11-24 MED ORDER — DOCUSATE SODIUM 100 MG PO CAPS
100.0000 mg | ORAL_CAPSULE | Freq: Two times a day (BID) | ORAL | Status: DC
  Administered 2019-11-24 – 2019-11-25 (×2): 100 mg via ORAL
  Filled 2019-11-24 (×2): qty 1

## 2019-11-24 MED ORDER — ACETAMINOPHEN 325 MG PO TABS: 650.0000 mg | ORAL_TABLET | Freq: Four times a day (QID) | ORAL | Status: DC | PRN

## 2019-11-24 NOTE — Progress Notes (Addendum)
Faculty [facility] OB/GYN Attending Note  Subjective:  Patient with multiple episodes of vomiting this morning, actively vomiting when I first came to round on her. Currently doing okay. Reports having some flatus, not able to tolerate any liquids or solids.  Pain is controlled on Dilaudid. Had some contractions last night that resolved, not currently. FHR reassuring, no LOF or vaginal bleeding. Good FM.   Admitted on 11/23/2019 S/P laparoscopic appendectomy, presented with acute appendicitis.     Objective:  Blood pressure 102/66, pulse (!) 105, temperature 98.2 F (36.8 C), temperature source Oral, resp. rate 17, height 5' 0.98" (1.549 m), weight 50 kg, last menstrual period 05/09/2019, SpO2 100 %. FHT  Baseline 155 bpm Gen: NAD HENT: Normocephalic, atraumatic Lungs: Normal respiratory effort Heart: Regular rate noted Abdomen: gravid fundus, soft, tender around incisions, incisions C/D/I with Dermabond Cervix: Deferred Ext: 2+ DTRs, no edema, no cyanosis, negative Homan's sign  Assessment & Plan:  34 y.o. G1P0 at [redacted]w[redacted]d POD#1 s/p laparascopic appendectomy, complicated by significant emesis. Concerned about possible ileus.  General Surgery informed.  - NPO for now, IV fluids - Zofran and Phenergan as needed - Analgesia as needed - Appreciate General Surgery recommendations and care of this patient. - Continue close observation for now, discharge not anticipated today. - May need imaging later if symptoms continue. - Reassuring FHT, no OB concerns currently - Continue antenatal care.   Jaynie Collins, MD, FACOG Obstetrician & Gynecologist, [facility] for [employer], [facility]

## 2019-11-24 NOTE — Progress Notes (Addendum)
[facility] [place] [facility] Progress Note  1 Day Post-Op  Subjective: CC-  Main complaint is nausea this morning. States that she vomits when she tries to sip on liquids. Only getting dilaudid for pain. Passing some flatus, no BM.  Objective: Vital signs in last 24 hours: Temp:  [97.7 F (36.5 C)-98.1 F (36.7 C)] 98 F (36.7 C) (03/05 0447) Pulse Rate:  [89-112] 104 (03/05 0447) Resp:  [14-21] 16 (03/05 0000) BP: (108-137)/(65-90) 120/82 (03/05 0447) SpO2:  [96 %-100 %] 100 % (03/05 0447) Weight:  [50 kg] 50 kg (03/04 1710)    Intake/Output from previous day: 03/04 0701 - 03/05 0700 In: 1110 [I.V.:1000; IV Piggyback:110] Out: 1155 [Urine:850; Emesis/NG output:300; Blood:5] Intake/Output this shift: No intake/output data recorded.  PE: Gen:  Alert, NAD, pleasant Card:  RRR Pulm:  CTAB, no W/R/R, rate and effort normal Abd: gravid, soft, appropriately tender, +BS, lap incisions C/D/I  Lab Results:  Recent Labs    11/23/19 1224  WBC 7.3  HGB 9.1*  HCT 28.9*  PLT 361   BMET No results for input(s): NA, K, CL, CO2, GLUCOSE, BUN, CREATININE, CALCIUM in the last 72 hours. PT/INR No results for input(s): LABPROT, INR in the last 72 hours. CMP     Component Value Date/Time   NA 137 10/25/2019 1527   K 3.9 10/25/2019 1527   CL 106 10/25/2019 1527   CO2 22 10/25/2019 1527   GLUCOSE 81 10/25/2019 1527   BUN 9 10/25/2019 1527   CREATININE 0.56 10/25/2019 1527   CALCIUM 8.7 (L) 10/25/2019 1527   PROT 7.4 10/25/2019 1527   ALBUMIN 3.5 10/25/2019 1527   AST 23 10/25/2019 1527   ALT 17 10/25/2019 1527   ALKPHOS 36 (L) 10/25/2019 1527   BILITOT 0.6 10/25/2019 1527   GFRNONAA >60 10/25/2019 1527   GFRAA >60 10/25/2019 1527   Lipase     Component Value Date/Time   LIPASE 38 10/25/2019 1527       Studies/Results: MR PELVIS WO CONTRAST  Result Date: 11/23/2019 CLINICAL DATA:  Right lower quadrant abdominal pain.  Pregnancy. EXAM: MRI ABDOMEN AND PELVIS WITHOUT  CONTRAST TECHNIQUE: Multiplanar multisequence MR imaging of the abdomen and pelvis was performed. No intravenous contrast was administered. COMPARISON:  None. FINDINGS: COMBINED FINDINGS FOR BOTH MR ABDOMEN AND PELVIS Lower chest: Unremarkable Hepatobiliary: Unremarkable Pancreas:  Unremarkable Spleen:  Unremarkable Adrenals/Urinary Tract: Unremarkable. No hydronephrosis or hydroureter. Stomach/Bowel: A tubular structure believed to represent the appendix measures 0.8 cm in diameter, mildly thickened. No surrounding fluid signal or abscess. No obvious surrounding inflammatory stranding. No dilated bowel. Vascular/Lymphatic:  Unremarkable Reproductive: Single intrauterine pregnancy, breech presentation, left posterior placenta without previa. No specific fetal abnormality is identified, but please note that today's exam not a dedicated fetal MRI. Other:  No supplemental non-categorized findings. Musculoskeletal: Unremarkable IMPRESSION: 1. The appendix measures 0.8 cm in diameter which is mildly abnormally thickened suspicious for early acute appendicitis. However, there is no surrounding inflammatory stranding or fluid. 2. Single intrauterine pregnancy in breech presentation. Electronically Signed   By: Van Clines M.D.   On: 11/23/2019 15:15   MR ABDOMEN WO CONTRAST  Result Date: 11/23/2019 CLINICAL DATA:  Right lower quadrant abdominal pain.  Pregnancy. EXAM: MRI ABDOMEN AND PELVIS WITHOUT CONTRAST TECHNIQUE: Multiplanar multisequence MR imaging of the abdomen and pelvis was performed. No intravenous contrast was administered. COMPARISON:  None. FINDINGS: COMBINED FINDINGS FOR BOTH MR ABDOMEN AND PELVIS Lower chest: Unremarkable Hepatobiliary: Unremarkable Pancreas:  Unremarkable Spleen:  Unremarkable Adrenals/Urinary Tract:  Unremarkable. No hydronephrosis or hydroureter. Stomach/Bowel: A tubular structure believed to represent the appendix measures 0.8 cm in diameter, mildly thickened. No surrounding  fluid signal or abscess. No obvious surrounding inflammatory stranding. No dilated bowel. Vascular/Lymphatic:  Unremarkable Reproductive: Single intrauterine pregnancy, breech presentation, left posterior placenta without previa. No specific fetal abnormality is identified, but please note that today's exam not a dedicated fetal MRI. Other:  No supplemental non-categorized findings. Musculoskeletal: Unremarkable IMPRESSION: 1. The appendix measures 0.8 cm in diameter which is mildly abnormally thickened suspicious for early acute appendicitis. However, there is no surrounding inflammatory stranding or fluid. 2. Single intrauterine pregnancy in breech presentation. Electronically Signed   By: Gaylyn Rong M.D.   On: 11/23/2019 15:15    Anti-infectives: Anti-infectives (From admission, onward)   Start     Dose/Rate Route Frequency Ordered Stop   11/23/19 1700  cefTRIAXone (ROCEPHIN) 2 g in sodium chloride 0.9 % 100 mL IVPB  Status:  Discontinued     2 g 200 mL/hr over 30 Minutes Intravenous Every 24 hours 11/23/19 1642 11/23/19 2132   11/23/19 1700  metroNIDAZOLE (FLAGYL) IVPB 500 mg  Status:  Discontinued     500 mg 100 mL/hr over 60 Minutes Intravenous Every 8 hours 11/23/19 1642 11/23/19 2132       Assessment/Plan [redacted] weeks pregnant, G1P0  Grade 1a appendicitis: mildly inflamed appendix S/p laparoscopic appendectomy 3/4 Dr. Bedelia Person - POD#1 - Check labs this morning. Hold dilaudid and try tylenol +/- flexeril if needed for pain, dilaudid may be contributing to her n/v, may also have a mild ileus. Mobilize. Add colace/miralax. Likely will not be ready for discharge today.  ID - rocephin/flagyl 3/4 FEN - IVF, NPO VTE - SCDs, mobilize Foley - none Follow up - DOW follow up info and discharge instructions on AVS   LOS: 1 day    Franne Forts, [facility] 11/24/2019, 8:21 AM Please see Amion for pager number during day hours 7:00am-4:30pm

## 2019-11-24 NOTE — Discharge Instructions (Signed)
[facility] ° °Please arrive at least 30 min before your appointment to complete your check in paperwork.  If you are unable to arrive 30 min prior to your appointment time we may have to cancel or reschedule you. °LAPAROSCOPIC SURGERY: POST OP INSTRUCTIONS °Always review your discharge instruction sheet given to you by the facility where your surgery was performed. °IF YOU HAVE DISABILITY OR FAMILY LEAVE FORMS, YOU MUST BRING THEM TO THE OFFICE FOR PROCESSING.   °DO NOT GIVE THEM TO YOUR DOCTOR. ° °PAIN CONTROL ° °1. First take acetaminophen (Tylenol) AND/or ibuprofen (Advil) to control your pain after surgery.  Follow directions on package.  Taking acetaminophen (Tylenol) and/or ibuprofen (Advil) regularly after surgery will help to control your pain and lower the amount of prescription pain medication you may need.  You should not take more than 4,000 mg (4 grams) of acetaminophen (Tylenol) in 24 hours.  You should not take ibuprofen (Advil), aleve, motrin, naprosyn or other NSAIDS if you have a history of stomach ulcers or chronic kidney disease.  °2. A prescription for pain medication may be given to you upon discharge.  Take your pain medication as prescribed, if you still have uncontrolled pain after taking acetaminophen (Tylenol) or ibuprofen (Advil). °3. Use ice packs to help control pain. °4. If you need a refill on your pain medication, please contact your pharmacy.  They will contact our office to request authorization. Prescriptions will not be filled after 5pm or on week-ends. ° °HOME MEDICATIONS °5. Take your usually prescribed medications unless otherwise directed. ° °DIET °6. You should follow a light diet the first few days after arrival home.  Be sure to include lots of fluids daily. Avoid fatty, fried foods.  ° °CONSTIPATION °7. It is common to experience some constipation after surgery and if you are taking pain medication.  Increasing fluid intake and taking a stool  softener (such as Colace) will usually help or prevent this problem from occurring.  A mild laxative (Milk of Magnesia or Miralax) should be taken according to package instructions if there are no bowel movements after 48 hours. ° °WOUND/INCISION CARE °8. Most patients will experience some swelling and bruising in the area of the incisions.  Ice packs will help.  Swelling and bruising can take several days to resolve.  °9. Unless discharge instructions indicate otherwise, follow guidelines below  °a. STERI-STRIPS - you may remove your outer bandages 48 hours after surgery, and you may shower at that time.  You have steri-strips (small skin tapes) in place directly over the incision.  These strips should be left on the skin for 7-10 days.   °b. DERMABOND/SKIN GLUE - you may shower in 24 hours.  The glue will flake off over the next 2-3 weeks. °10. Any sutures or staples will be removed at the office during your follow-up visit. ° °ACTIVITIES °11. You may resume regular (light) daily activities beginning the next day--such as daily self-care, walking, climbing stairs--gradually increasing activities as tolerated.  You may have sexual intercourse when it is comfortable.  Refrain from any heavy lifting or straining until approved by your doctor. °a. You may drive when you are no longer taking prescription pain medication, you can comfortably wear a seatbelt, and you can safely maneuver your car and apply brakes. ° °FOLLOW-UP °12. You should see your doctor in the office for a follow-up appointment approximately 2-3 weeks after your surgery.  You should have been given your post-op/follow-up appointment when   your surgery was scheduled.  If you did not receive a post-op/follow-up appointment, make sure that you call for this appointment within a day or two after you arrive home to insure a convenient appointment time. ° °OTHER INSTRUCTIONS ° °WHEN TO CALL YOUR DOCTOR: °1. Fever over 101.0 °2. Inability to  urinate °3. Continued bleeding from incision. °4. Increased pain, redness, or drainage from the incision. °5. Increasing abdominal pain ° °The clinic staff is available to answer your questions during regular business hours.  Please don’t hesitate to call and ask to speak to one of the nurses for clinical concerns.  If you have a medical emergency, go to the nearest emergency room or call 911.  A surgeon from [facility] is always on call at the hospital. °[address], [address], [place], [place]  [address] ? [address] [address], [place], [place]   [address] °[phone] ? [phone] ? FAX [phone] ° ° ° °

## 2019-11-25 DIAGNOSIS — Z9049 Acquired absence of other specified parts of digestive tract: Secondary | ICD-10-CM

## 2019-11-25 MED ORDER — PROMETHAZINE HCL 25 MG PO TABS: 25.0000 mg | ORAL_TABLET | Freq: Four times a day (QID) | ORAL | 1 refills | Status: DC | PRN

## 2019-11-25 MED ORDER — OXYCODONE-ACETAMINOPHEN 5-325 MG PO TABS: 1.0000 | ORAL_TABLET | Freq: Four times a day (QID) | ORAL | 0 refills | Status: DC | PRN

## 2019-11-25 MED ORDER — OXYCODONE HCL 5 MG/5ML PO SOLN: 5.0000 mg | ORAL | 0 refills | Status: DC | PRN

## 2019-11-25 NOTE — Plan of Care (Signed)
Pt to be discharged with printed instructions. No concerns noted at this time. Carmelina Dane, RN

## 2019-11-25 NOTE — Progress Notes (Signed)
2 Days Post-Op  Subjective: CC: Patient already with discharge order in. She reports that she is tolerating her clear liquid diet without nausea or vomiting.  She reports her pain is controlled.  Did have 1 dose of Dilaudid in the early a.m.  Mobilizing without difficulties.  Urinating without difficulties.  Objective: Vital signs in last 24 hours: Temp:  [97.8 F (36.6 C)-98.7 F (37.1 C)] 98.7 F (37.1 C) (03/06 0315) Pulse Rate:  [97-109] 109 (03/06 0315) Resp:  [16-18] 16 (03/06 0315) BP: (102-115)/(61-71) 102/61 (03/06 0315) SpO2:  [97 %-100 %] 97 % (03/06 0315)    Intake/Output from previous day: 03/05 0701 - 03/06 0700 In: 3016.1 [P.O.:898; I.V.:2118.1] Out: 1200 [Urine:800; Emesis/NG output:400] Intake/Output this shift: No intake/output data recorded.  PE: Gen:  Alert, NAD, pleasant Pulm:  Rate and effort normal Abd: gravid, soft, appropriately tender, +BS, lap incisions C/D/I  Lab Results:  Recent Labs    11/23/19 1224 11/24/19 0919  WBC 7.3 8.8  HGB 9.1* 8.7*  HCT 28.9* 27.9*  PLT 361 319   BMET Recent Labs    11/24/19 0919  NA 136  K 3.7  CL 102  CO2 23  GLUCOSE 108*  BUN 5*  CREATININE 0.64  CALCIUM 8.5*   PT/INR No results for input(s): LABPROT, INR in the last 72 hours. CMP     Component Value Date/Time   NA 136 11/24/2019 0919   K 3.7 11/24/2019 0919   CL 102 11/24/2019 0919   CO2 23 11/24/2019 0919   GLUCOSE 108 (H) 11/24/2019 0919   BUN 5 (L) 11/24/2019 0919   CREATININE 0.64 11/24/2019 0919   CALCIUM 8.5 (L) 11/24/2019 0919   PROT 7.4 10/25/2019 1527   ALBUMIN 3.5 10/25/2019 1527   AST 23 10/25/2019 1527   ALT 17 10/25/2019 1527   ALKPHOS 36 (L) 10/25/2019 1527   BILITOT 0.6 10/25/2019 1527   GFRNONAA >60 11/24/2019 0919   GFRAA >60 11/24/2019 0919   Lipase     Component Value Date/Time   LIPASE 38 10/25/2019 1527       Studies/Results: MR PELVIS WO CONTRAST  Result Date: 11/23/2019 CLINICAL DATA:  Right  lower quadrant abdominal pain.  Pregnancy. EXAM: MRI ABDOMEN AND PELVIS WITHOUT CONTRAST TECHNIQUE: Multiplanar multisequence MR imaging of the abdomen and pelvis was performed. No intravenous contrast was administered. COMPARISON:  None. FINDINGS: COMBINED FINDINGS FOR BOTH MR ABDOMEN AND PELVIS Lower chest: Unremarkable Hepatobiliary: Unremarkable Pancreas:  Unremarkable Spleen:  Unremarkable Adrenals/Urinary Tract: Unremarkable. No hydronephrosis or hydroureter. Stomach/Bowel: A tubular structure believed to represent the appendix measures 0.8 cm in diameter, mildly thickened. No surrounding fluid signal or abscess. No obvious surrounding inflammatory stranding. No dilated bowel. Vascular/Lymphatic:  Unremarkable Reproductive: Single intrauterine pregnancy, breech presentation, left posterior placenta without previa. No specific fetal abnormality is identified, but please note that today's exam not a dedicated fetal MRI. Other:  No supplemental non-categorized findings. Musculoskeletal: Unremarkable IMPRESSION: 1. The appendix measures 0.8 cm in diameter which is mildly abnormally thickened suspicious for early acute appendicitis. However, there is no surrounding inflammatory stranding or fluid. 2. Single intrauterine pregnancy in breech presentation. Electronically Signed   By: Gaylyn Rong M.D.   On: 11/23/2019 15:15   MR ABDOMEN WO CONTRAST  Result Date: 11/23/2019 CLINICAL DATA:  Right lower quadrant abdominal pain.  Pregnancy. EXAM: MRI ABDOMEN AND PELVIS WITHOUT CONTRAST TECHNIQUE: Multiplanar multisequence MR imaging of the abdomen and pelvis was performed. No intravenous contrast was administered. COMPARISON:  None.  FINDINGS: COMBINED FINDINGS FOR BOTH MR ABDOMEN AND PELVIS Lower chest: Unremarkable Hepatobiliary: Unremarkable Pancreas:  Unremarkable Spleen:  Unremarkable Adrenals/Urinary Tract: Unremarkable. No hydronephrosis or hydroureter. Stomach/Bowel: A tubular structure believed to  represent the appendix measures 0.8 cm in diameter, mildly thickened. No surrounding fluid signal or abscess. No obvious surrounding inflammatory stranding. No dilated bowel. Vascular/Lymphatic:  Unremarkable Reproductive: Single intrauterine pregnancy, breech presentation, left posterior placenta without previa. No specific fetal abnormality is identified, but please note that today's exam not a dedicated fetal MRI. Other:  No supplemental non-categorized findings. Musculoskeletal: Unremarkable IMPRESSION: 1. The appendix measures 0.8 cm in diameter which is mildly abnormally thickened suspicious for early acute appendicitis. However, there is no surrounding inflammatory stranding or fluid. 2. Single intrauterine pregnancy in breech presentation. Electronically Signed   By: Van Clines M.D.   On: 11/23/2019 15:15    Anti-infectives: Anti-infectives (From admission, onward)   Start     Dose/Rate Route Frequency Ordered Stop   11/23/19 1700  cefTRIAXone (ROCEPHIN) 2 g in sodium chloride 0.9 % 100 mL IVPB  Status:  Discontinued     2 g 200 mL/hr over 30 Minutes Intravenous Every 24 hours 11/23/19 1642 11/23/19 2132   11/23/19 1700  metroNIDAZOLE (FLAGYL) IVPB 500 mg  Status:  Discontinued     500 mg 100 mL/hr over 60 Minutes Intravenous Every 8 hours 11/23/19 1642 11/23/19 2132       Assessment/Plan [redacted] weeks pregnant, G1P0  Grade 1a appendicitis: mildly inflamed appendix S/p laparoscopic appendectomy 3/4 Dr. Bobbye Morton - POD#2 - Discharge order already placed by primary team.  Patient is tolerating clear liquid diet and pain is controlled.  Recommended diet advancement.  I have discussed with her discharge instructions and oral pain control.  Follow-up in AVS.  ID - rocephin/flagyl 3/4 FEN - IVF, soft VTE - SCDs, mobilize Foley - none Follow up - DOW follow up info and discharge instructions on AVS   LOS: 2 days    Jillyn Ledger , [facility] 11/25/2019, 7:51  AM Please see Amion for pager number during day hours 7:00am-4:30pm

## 2019-11-25 NOTE — Discharge Summary (Addendum)
Physician Discharge Summary  Patient ID: Kaylee Gibson MRN: [id number] DOB/AGE: 11-11-1985 34 y.o.  Admit date: 11/23/2019 Discharge date: 11/25/2019  Admission Diagnoses: [redacted]w[redacted]d with acute appendicitis Discharge Diagnoses:  Principal Problem:   S/P laparoscopic appendectomy Active Problems:   Acute appendicitis affecting pregnancy   Discharged Condition: good  Hospital Course: post op N/V which resolved otherwise unremarkable post op course  Consults: general surgery  Significant Diagnostic Studies:   Treatments: surgery: Laparoscopic appendectomy  Discharge Exam: Blood pressure 102/61, pulse (!) 109, temperature 98.7 F (37.1 C), temperature source Oral, resp. rate 16, height 5' 0.98" (1.549 m), weight 50 kg, last menstrual period 05/09/2019, SpO2 97 %. General appearance: alert, cooperative and no distress GI: soft, non-tender; bowel sounds normal; no masses,  no organomegaly  Disposition: Discharge disposition: 01-Home or Self Care       Discharge Instructions    Call MD for:  persistant nausea and vomiting   Complete by: As directed    Call MD for:  severe uncontrolled pain   Complete by: As directed    Call MD for:  temperature >100.4   Complete by: As directed    Diet - low sodium heart healthy   Complete by: As directed    Discharge wound care:   Complete by: As directed    Keep clean dry   Driving Restrictions   Complete by: As directed    No driving for 3 days   Increase activity slowly   Complete by: As directed    Lifting restrictions   Complete by: As directed    Do not lift more than 10 pounds for a week     Allergies as of 11/25/2019      Reactions   Penicillins Rash   Did it involve swelling of the face/tongue/throat, SOB, or low BP? No Did it involve sudden or severe rash/hives, skin peeling, or any reaction on the inside of your mouth or nose? No Did you need to seek medical attention at a hospital or doctor's office? Yes When did it  last happen?2020 If all above answers are "NO", may proceed with cephalosporin use.      Medication List    TAKE these medications   acetaminophen 325 MG tablet Commonly known as: TYLENOL Take 650 mg by mouth every 6 (six) hours as needed for mild pain or headache.   oxyCODONE-acetaminophen 5-325 MG tablet Commonly known as: PERCOCET/ROXICET Take 1-2 tablets by mouth every 6 (six) hours as needed.   Prenatal Vitamin 27-0.8 MG Tabs Take 1 Dose by mouth daily.   promethazine 25 MG tablet Commonly known as: PHENERGAN Take 1 tablet (25 mg total) by mouth every 6 (six) hours as needed for nausea.            Discharge Care Instructions  (From admission, onward)         Start     Ordered   11/25/19 0000  Discharge wound care:    Comments: Keep clean dry   11/25/19 0720         Follow-up Information    [facility] Go on 12/14/2019.   Specialty: General Surgery Why: Your appointment is 03/25 at 2:45pm Please arrive 30 minutes prior to your appointment to check in and fill out paperwork. Bring photo ID and insurance information. Contact information: [address] [place] [address] [phone]          Signed: Reva Bores 11/25/2019, 10:29 AM

## 2019-11-25 NOTE — Plan of Care (Signed)
Patient is tolerating a clear diet well with no further nausea or vomiting and is positive for flatus.Patient has tolerated ambulating to the bathroom well.Pain control has also improved per patient.

## 2019-11-27 ENCOUNTER — Telehealth (HOSPITAL_COMMUNITY): Payer: Self-pay | Admitting: Genetic Counselor

## 2019-11-27 NOTE — Telephone Encounter (Signed)
I called Kaylee Gibson to discuss her negative noninvasive prenatal screening (NIPS)/cell free DNA (cfDNA) testing result. Specifically, Kaylee Gibson had MaterniT21 testing through [facility]. These negative results demonstrated an expected representation of chromosome 21, 18, 13, and sex chromosome material, greatly reducing the likelihood of trisomies 21, 8, or 82 and sex chromosome aneuploidies for the pregnancy.   NIPS analyzes placental (fetal) DNA in maternal circulation. NIPS is considered to be highly specific and sensitive, but is not considered to be a diagnostic test. We reviewed that this testing identifies 91-99% of pregnancies with trisomies 22, 24, and 50, as well as sex chromosome abnormalities, but does not test for all genetic conditions. Diiagnostic testing via amniocentesis is available should Kaylee Gibson be interested in confirming this result. She confirmed that she had no questions about these results at this time.  Gershon Crane, MS Genetic Counselor

## 2019-11-28 LAB — SURGICAL PATHOLOGY

## 2019-12-04 ENCOUNTER — Other Ambulatory Visit: Payer: Self-pay

## 2019-12-04 ENCOUNTER — Encounter (HOSPITAL_COMMUNITY): Payer: Self-pay | Admitting: Obstetrics & Gynecology

## 2019-12-04 ENCOUNTER — Inpatient Hospital Stay (HOSPITAL_COMMUNITY)
Admission: AD | Admit: 2019-12-04 | Discharge: 2019-12-04 | Disposition: A | Discharge status: Home / Self Care | Payer: Medicaid Other | Attending: Obstetrics & Gynecology | Admitting: Obstetrics & Gynecology

## 2019-12-04 DIAGNOSIS — Z3A28 28 weeks gestation of pregnancy: Secondary | ICD-10-CM

## 2019-12-04 DIAGNOSIS — O219 Vomiting of pregnancy, unspecified: Secondary | ICD-10-CM

## 2019-12-04 DIAGNOSIS — O212 Late vomiting of pregnancy: Secondary | ICD-10-CM | POA: Insufficient documentation

## 2019-12-04 LAB — URINALYSIS, ROUTINE W REFLEX MICROSCOPIC
Bilirubin Urine: NEGATIVE
Glucose, UA: NEGATIVE mg/dL
Hgb urine dipstick: NEGATIVE
Ketones, ur: NEGATIVE mg/dL
Nitrite: NEGATIVE
Protein, ur: 100 mg/dL — AB
Specific Gravity, Urine: 1.027 (ref 1.005–1.030)
Squamous Epithelial / HPF: >50 — ABNORMAL HIGH (ref 0–5)
pH: 6 (ref 5.0–8.0)

## 2019-12-04 MED ORDER — PROMETHAZINE HCL 25 MG/ML IJ SOLN
25.0000 mg | Freq: Once | INTRAMUSCULAR | Status: AC
  Administered 2019-12-04: 25 mg via INTRAMUSCULAR
  Filled 2019-12-04: qty 1

## 2019-12-04 NOTE — MAU Provider Note (Signed)
Chief Complaint:  Nausea and Emesis   First Provider Initiated Contact with Patient 12/04/19 1344     HPI: Kaylee Gibson is a 34 y.o. G1P0 at [redacted]w[redacted]d who presents to maternity admissions reporting n/v. She had an appendectomy on 3/4. Reports episode of abdominal pain this morning when she woke up. Took dose of percocet which resolved pain. Has had n/v since then & has vomited over 5 times. Has phenergan at home but didn't take medication. Ate bojangles, which made her n/v worse. Denies fever/chills, contractions, dysuria, diarrhea, LOF, or vaginal bleeding. Good fetal movement. No abdominal pain since this morning. Reports normal bowel movements.   Pregnancy Course: [facility]. Appendectomy 11/23/19. Fetal peylectasis  Past Medical History:  Diagnosis Date  . Eczema    OB History  Gravida Para Term Preterm AB Living  1         0  SAB TAB Ectopic Multiple Live Births               # Outcome Date GA Lbr Len/2nd Weight Sex Delivery Anes PTL Lv  1 Current            Past Surgical History:  Procedure Laterality Date  . LAPAROSCOPIC APPENDECTOMY N/A 11/23/2019   Procedure: APPENDECTOMY LAPAROSCOPIC;  Surgeon: Diamantina Monks, MD;  Location: [facility] OR;  Service: General;  Laterality: N/A;  . NO PAST SURGERIES     Family History  Problem Relation Age of Onset  . Asthma Mother   . Asthma Father   . Brain cancer Brother        Neuroblastoma   . Diabetes Maternal Aunt    Social History   Tobacco Use  . Smoking status: Never Smoker  . Smokeless tobacco: Never Used  Substance Use Topics  . Alcohol use: No    Alcohol/week: 0.0 standard drinks  . Drug use: No   Allergies  Allergen Reactions  . Penicillins Rash    Did it involve swelling of the face/tongue/throat, SOB, or low BP? No Did it involve sudden or severe rash/hives, skin peeling, or any reaction on the inside of your mouth or nose? No Did you need to seek medical attention at a hospital or doctor's office? Yes When did it last  happen?2020 If all above answers are "NO", may proceed with cephalosporin use.    No medications prior to admission.    I have reviewed patient's Past Medical Hx, Surgical Hx, Family Hx, Social Hx, medications and allergies.   ROS:  Review of Systems  Constitutional: Negative.   Gastrointestinal: Positive for abdominal pain (none currently), nausea and vomiting. Negative for constipation and diarrhea.  Genitourinary: Negative.     Physical Exam   Patient Vitals for the past 24 hrs:  BP Temp Temp src Pulse Resp SpO2 Height Weight  12/04/19 1529 (!) 98/59 98.3 F (36.8 C) Oral 91 16 100 % - -  12/04/19 1516 (!) 98/59 98.3 F (36.8 C) Oral 91 16 100 % - -  12/04/19 1351 - - - - - 100 % - -  12/04/19 1346 - - - - - 100 % - -  12/04/19 1330 (!) 109/58 - - 91 - 100 % - -  12/04/19 1321 107/62 97.7 F (36.5 C) Oral 87 18 100 % 5\' 1"  (1.549 m) 57.2 kg    Constitutional: Well-developed, well-nourished female in no acute distress.  Cardiovascular: normal rate & rhythm, no murmur Respiratory: normal effort, lung sounds clear throughout GI: Abd soft, non-tender, gravid appropriate  for gestational age. Pos BS x 4. Lap incisions well healed.  MS: Extremities nontender, no edema, normal ROM Neurologic: Alert and oriented x 4.  GU:   Cervix closed/thick  NST:  Baseline: 135 bpm, Variability: Good {> 6 bpm), Accelerations: Non-reactive but appropriate for gestational age and Decelerations: Absent   Labs: Results for orders placed or performed during the hospital encounter of 12/04/19 (from the past 24 hour(s))  Urinalysis, Routine w reflex microscopic     Status: Abnormal   Collection Time: 12/04/19  2:03 PM  Result Value Ref Range   Color, Urine YELLOW YELLOW   APPearance CLOUDY (A) CLEAR   Specific Gravity, Urine 1.027 1.005 - 1.030   pH 6.0 5.0 - 8.0   Glucose, UA NEGATIVE NEGATIVE mg/dL   Hgb urine dipstick NEGATIVE NEGATIVE   Bilirubin Urine NEGATIVE NEGATIVE   Ketones,  ur NEGATIVE NEGATIVE mg/dL   Protein, ur 100 (A) NEGATIVE mg/dL   Nitrite NEGATIVE NEGATIVE   Leukocytes,Ua TRACE (A) NEGATIVE   RBC / HPF 0-5 0 - 5 RBC/hpf   WBC, UA 11-20 0 - 5 WBC/hpf   Bacteria, UA RARE (A) NONE SEEN   Squamous Epithelial / LPF >50 (H) 0 - 5   Mucus PRESENT    Hyaline Casts, UA PRESENT     Imaging:  No results found.  MAU Course: Orders Placed This Encounter  Procedures  . Urinalysis, Routine w reflex microscopic  . Discharge patient   Meds ordered this encounter  Medications  . promethazine (PHENERGAN) injection 25 mg    MDM: Fetal tracing appropriate for gestation. Some UI on monitor which resolved with observation. Cervix closed/thick.  Pt currently without abdominal pain. Abdomen soft & non tender. Pt afebrile. Having normal BMs.   N/v improved with IM phenergan & patient able to tolerate ginger ale. Will d/c home.   Assessment: 1. Vomiting or nausea of pregnancy   2. [redacted] weeks gestation of pregnancy     Plan: Discharge home in stable condition.  Preterm Labor precautions and fetal kick counts Take phenergan prn F/u with [facility] as scheduled Keep f/u with [facility]  Allergies as of 12/04/2019      Reactions   Penicillins Rash   Did it involve swelling of the face/tongue/throat, SOB, or low BP? No Did it involve sudden or severe rash/hives, skin peeling, or any reaction on the inside of your mouth or nose? No Did you need to seek medical attention at a hospital or doctor's office? Yes When did it last happen?2020 If all above answers are "NO", may proceed with cephalosporin use.      Medication List    TAKE these medications   acetaminophen 325 MG tablet Commonly known as: TYLENOL Take 650 mg by mouth every 6 (six) hours as needed for mild pain or headache.   oxyCODONE-acetaminophen 5-325 MG tablet Commonly known as: PERCOCET/ROXICET Take 1-2 tablets by mouth every 6 (six) hours as needed.   Prenatal Vitamin  27-0.8 MG Tabs Take 1 Dose by mouth daily.   promethazine 25 MG tablet Commonly known as: PHENERGAN Take 1 tablet (25 mg total) by mouth every 6 (six) hours as needed for nausea.       Jorje Guild, NP 12/04/2019 4:11 PM

## 2019-12-04 NOTE — MAU Note (Addendum)
Pt c/o one episode of  new onset pain on right side where she had appendectomy on 3/4. Pain started  around 11 this morning, followed by nausea and vomiting x 5-6 times.  Denies pain at this time. Denies cntrx, VB, LOF, or decreased fetal movement. Took her Percocet this morning. Tried eating Bojangles after nausea started but was unable to keep it down. Was not able to urinate.

## 2019-12-04 NOTE — Discharge Instructions (Signed)
Fetal Movement Counts Patient Name: ________________________________________________ Patient Due Date: ____________________ What is a fetal movement count?  A fetal movement count is the number of times that you feel your baby move during a certain amount of time. This may also be called a fetal kick count. A fetal movement count is recommended for every pregnant woman. You may be asked to start counting fetal movements as early as week 28 of your pregnancy. Pay attention to when your baby is most active. You may notice your baby's sleep and wake cycles. You may also notice things that make your baby move more. You should do a fetal movement count:  When your baby is normally most active.  At the same time each day. A good time to count movements is while you are resting, after having something to eat and drink. How do I count fetal movements? 1. Find a quiet, comfortable area. Sit, or lie down on your side. 2. Write down the date, the start time and stop time, and the number of movements that you felt between those two times. Take this information with you to your health care visits. 3. Write down your start time when you feel the first movement. 4. Count kicks, flutters, swishes, rolls, and jabs. You should feel at least 10 movements. 5. You may stop counting after you have felt 10 movements, or if you have been counting for 2 hours. Write down the stop time. 6. If you do not feel 10 movements in 2 hours, contact your health care provider for further instructions. Your health care provider may want to do additional tests to assess your baby's well-being. Contact a health care provider if:  You feel fewer than 10 movements in 2 hours.  Your baby is not moving like he or she usually does. Date: ____________ Start time: ____________ Stop time: ____________ Movements: ____________ Date: ____________ Start time: ____________ Stop time: ____________ Movements: ____________ Date: ____________  Start time: ____________ Stop time: ____________ Movements: ____________ Date: ____________ Start time: ____________ Stop time: ____________ Movements: ____________ Date: ____________ Start time: ____________ Stop time: ____________ Movements: ____________ Date: ____________ Start time: ____________ Stop time: ____________ Movements: ____________ Date: ____________ Start time: ____________ Stop time: ____________ Movements: ____________ Date: ____________ Start time: ____________ Stop time: ____________ Movements: ____________ Date: ____________ Start time: ____________ Stop time: ____________ Movements: ____________ This information is not intended to replace advice given to you by your health care provider. Make sure you discuss any questions you have with your health care provider. Document Revised: 04/27/2019 Document Reviewed: 04/27/2019 Elsevier Patient Education  2020 [employer]  

## 2019-12-05 NOTE — Addendum Note (Signed)
Addendum  created 12/05/19 2101 by Achille Rich, MD   Intraprocedure Attestations deleted, Intraprocedure Staff edited

## 2019-12-07 LAB — OB RESULTS CONSOLE RPR: RPR: NONREACTIVE

## 2019-12-21 ENCOUNTER — Other Ambulatory Visit: Payer: Self-pay | Admitting: Obstetrics & Gynecology

## 2019-12-21 DIAGNOSIS — O99019 Anemia complicating pregnancy, unspecified trimester: Secondary | ICD-10-CM

## 2019-12-21 DIAGNOSIS — D509 Iron deficiency anemia, unspecified: Secondary | ICD-10-CM

## 2019-12-22 ENCOUNTER — Encounter (HOSPITAL_COMMUNITY)
Admission: RE | Admit: 2019-12-22 | Discharge: 2019-12-22 | Disposition: A | Discharge status: Home / Self Care | Payer: Medicaid Other | Source: Ambulatory Visit | Attending: Obstetrics & Gynecology | Admitting: Obstetrics & Gynecology

## 2019-12-22 ENCOUNTER — Other Ambulatory Visit: Payer: Self-pay

## 2019-12-22 DIAGNOSIS — D509 Iron deficiency anemia, unspecified: Secondary | ICD-10-CM | POA: Diagnosis present

## 2019-12-22 DIAGNOSIS — O99019 Anemia complicating pregnancy, unspecified trimester: Secondary | ICD-10-CM | POA: Insufficient documentation

## 2019-12-22 MED ORDER — SODIUM CHLORIDE 0.9 % IV SOLN
510.0000 mg | INTRAVENOUS | Status: DC
  Administered 2019-12-22: 510 mg via INTRAVENOUS
  Filled 2019-12-22: qty 17

## 2019-12-22 NOTE — Progress Notes (Signed)
At bedside after beginning first time infusion of IV Feraheme at slower rate of 259ml/hour at 1008. Pt became nauseated and vomited at 1011. IV Feraheme stopped and NS fluid started per protocol. Pt reported feeling better and vomiting had stopped by 1015. Will inform OB physician and continue monitoring pt. Fetal heart rate auscultated about 3 minutes after vomiting and was 147. Pt's VSS.

## 2019-12-22 NOTE — Discharge Instructions (Signed)

## 2019-12-22 NOTE — Progress Notes (Addendum)
Attempted to call Dr. Jaynie Collins. Offices closed today. Waited for call back from on call physician for 15 minutes. After no return call, called rapid response physician, spoke to Dr. Shawnie Pons and informed of reaction, what had been done, that patient reported feeling better and wanted to leave. Per her, okay for patient to be DC home and to cancel next IV Feraheme infusion appointment and discontinue order for second dose. Patient DC home at 1055. Condition stable at this time.

## 2019-12-29 ENCOUNTER — Encounter (HOSPITAL_COMMUNITY): Payer: Self-pay

## 2019-12-29 ENCOUNTER — Ambulatory Visit (HOSPITAL_COMMUNITY)
Admission: RE | Admit: 2019-12-29 | Discharge: 2019-12-29 | Disposition: A | Discharge status: Home / Self Care | Payer: Medicaid Other | Source: Ambulatory Visit | Attending: Obstetrics and Gynecology | Admitting: Obstetrics and Gynecology

## 2019-12-29 ENCOUNTER — Other Ambulatory Visit: Payer: Self-pay

## 2019-12-29 ENCOUNTER — Ambulatory Visit (HOSPITAL_COMMUNITY): Payer: Medicaid Other | Admitting: *Deleted

## 2019-12-29 ENCOUNTER — Encounter (HOSPITAL_COMMUNITY): Payer: Medicaid Other

## 2019-12-29 VITALS — BP 97/70 | HR 100 | Temp 97.9°F

## 2019-12-29 DIAGNOSIS — O35EXX0 Maternal care for other (suspected) fetal abnormality and damage, fetal genitourinary anomalies, not applicable or unspecified: Secondary | ICD-10-CM

## 2019-12-29 DIAGNOSIS — Z362 Encounter for other antenatal screening follow-up: Secondary | ICD-10-CM | POA: Diagnosis not present

## 2019-12-29 DIAGNOSIS — O359XX0 Maternal care for (suspected) fetal abnormality and damage, unspecified, not applicable or unspecified: Secondary | ICD-10-CM

## 2019-12-29 DIAGNOSIS — O358XX0 Maternal care for other (suspected) fetal abnormality and damage, not applicable or unspecified: Secondary | ICD-10-CM | POA: Diagnosis not present

## 2019-12-29 DIAGNOSIS — Z3A31 31 weeks gestation of pregnancy: Secondary | ICD-10-CM | POA: Diagnosis not present

## 2020-01-11 ENCOUNTER — Encounter: Payer: Self-pay | Admitting: *Deleted

## 2020-01-24 ENCOUNTER — Inpatient Hospital Stay (HOSPITAL_COMMUNITY)
Admission: AD | Admit: 2020-01-24 | Discharge: 2020-01-24 | Disposition: A | Discharge status: Home / Self Care | Payer: Medicaid Other | Attending: Obstetrics and Gynecology | Admitting: Obstetrics and Gynecology

## 2020-01-24 ENCOUNTER — Encounter (HOSPITAL_COMMUNITY): Payer: Self-pay | Admitting: Obstetrics and Gynecology

## 2020-01-24 ENCOUNTER — Other Ambulatory Visit: Payer: Self-pay

## 2020-01-24 DIAGNOSIS — Z79899 Other long term (current) drug therapy: Secondary | ICD-10-CM | POA: Diagnosis not present

## 2020-01-24 DIAGNOSIS — R109 Unspecified abdominal pain: Secondary | ICD-10-CM

## 2020-01-24 DIAGNOSIS — O26893 Other specified pregnancy related conditions, third trimester: Secondary | ICD-10-CM | POA: Diagnosis not present

## 2020-01-24 DIAGNOSIS — Z3A35 35 weeks gestation of pregnancy: Secondary | ICD-10-CM

## 2020-01-24 LAB — URINALYSIS, ROUTINE W REFLEX MICROSCOPIC
Bilirubin Urine: NEGATIVE
Glucose, UA: NEGATIVE mg/dL
Hgb urine dipstick: NEGATIVE
Ketones, ur: NEGATIVE mg/dL
Leukocytes,Ua: NEGATIVE
Nitrite: NEGATIVE
Protein, ur: NEGATIVE mg/dL
Specific Gravity, Urine: 1.016 (ref 1.005–1.030)
pH: 6 (ref 5.0–8.0)

## 2020-01-24 NOTE — Discharge Instructions (Signed)
Fetal Movement Counts Patient Name: ________________________________________________ Patient Due Date: ____________________ What is a fetal movement count?  A fetal movement count is the number of times that you feel your baby move during a certain amount of time. This may also be called a fetal kick count. A fetal movement count is recommended for every pregnant woman. You may be asked to start counting fetal movements as early as week 28 of your pregnancy. Pay attention to when your baby is most active. You may notice your baby's sleep and wake cycles. You may also notice things that make your baby move more. You should do a fetal movement count:  When your baby is normally most active.  At the same time each day. A good time to count movements is while you are resting, after having something to eat and drink. How do I count fetal movements? 1. Find a quiet, comfortable area. Sit, or lie down on your side. 2. Write down the date, the start time and stop time, and the number of movements that you felt between those two times. Take this information with you to your health care visits. 3. Write down your start time when you feel the first movement. 4. Count kicks, flutters, swishes, rolls, and jabs. You should feel at least 10 movements. 5. You may stop counting after you have felt 10 movements, or if you have been counting for 2 hours. Write down the stop time. 6. If you do not feel 10 movements in 2 hours, contact your health care provider for further instructions. Your health care provider may want to do additional tests to assess your baby's well-being. Contact a health care provider if:  You feel fewer than 10 movements in 2 hours.  Your baby is not moving like he or she usually does. Date: ____________ Start time: ____________ Stop time: ____________ Movements: ____________ Date: ____________ Start time: ____________ Stop time: ____________ Movements: ____________ Date: ____________  Start time: ____________ Stop time: ____________ Movements: ____________ Date: ____________ Start time: ____________ Stop time: ____________ Movements: ____________ Date: ____________ Start time: ____________ Stop time: ____________ Movements: ____________ Date: ____________ Start time: ____________ Stop time: ____________ Movements: ____________ Date: ____________ Start time: ____________ Stop time: ____________ Movements: ____________ Date: ____________ Start time: ____________ Stop time: ____________ Movements: ____________ Date: ____________ Start time: ____________ Stop time: ____________ Movements: ____________ This information is not intended to replace advice given to you by your health care provider. Make sure you discuss any questions you have with your health care provider. Document Revised: 04/27/2019 Document Reviewed: 04/27/2019 Elsevier Patient Education  2020 [employer]        Signs and Symptoms of Labor Labor is your body's natural process of moving your baby, placenta, and umbilical cord out of your uterus. The process of labor usually starts when your baby is full-term, between 37 and 40 weeks of pregnancy. How will I know when I am close to going into labor? As your body prepares for labor and the birth of your baby, you may notice the following symptoms in the weeks and days before true labor starts:  Having a strong desire to get your home ready to receive your new baby. This is called nesting. Nesting may be a sign that labor is approaching, and it may occur several weeks before birth. Nesting may involve cleaning and organizing your home.  Passing a small amount of thick, bloody mucus out of your vagina (normal bloody show or losing your mucus plug). This may happen more than a   week before labor begins, or it might occur right before labor begins as the opening of the cervix starts to widen (dilate). For some women, the entire mucus plug passes at once. For others,  smaller portions of the mucus plug may gradually pass over several days.  Your baby moving (dropping) lower in your pelvis to get into position for birth (lightening). When this happens, you may feel more pressure on your bladder and pelvic bone and less pressure on your ribs. This may make it easier to breathe. It may also cause you to need to urinate more often and have problems with bowel movements.  Having "practice contractions" (Braxton Hicks contractions) that occur at irregular (unevenly spaced) intervals that are more than 10 minutes apart. This is also called false labor. False labor contractions are common after exercise or sexual activity, and they will stop if you change position, rest, or drink fluids. These contractions are usually mild and do not get stronger over time. They may feel like: ? A backache or back pain. ? Mild cramps, similar to menstrual cramps. ? Tightening or pressure in your abdomen. Other early symptoms that labor may be starting soon include:  Nausea or loss of appetite.  Diarrhea.  Having a sudden burst of energy, or feeling very tired.  Mood changes.  Having trouble sleeping. How will I know when labor has begun? Signs that true labor has begun may include:  Having contractions that come at regular (evenly spaced) intervals and increase in intensity. This may feel like more intense tightening or pressure in your abdomen that moves to your back. ? Contractions may also feel like rhythmic pain in your upper thighs or back that comes and goes at regular intervals. ? For first-time mothers, this change in intensity of contractions often occurs at a more gradual pace. ? Women who have given birth before may notice a more rapid progression of contraction changes.  Having a feeling of pressure in the vaginal area.  Your water breaking (rupture of membranes). This is when the sac of fluid that surrounds your baby breaks. When this happens, you will notice  fluid leaking from your vagina. This may be clear or blood-tinged. Labor usually starts within 24 hours of your water breaking, but it may take longer to begin. ? Some women notice this as a gush of fluid. ? Others notice that their underwear repeatedly becomes damp. Follow these instructions at home:   When labor starts, or if your water breaks, call your health care provider or nurse care line. Based on your situation, they will determine when you should go in for an exam.  When you are in early labor, you may be able to rest and manage symptoms at home. Some strategies to try at home include: ? Breathing and relaxation techniques. ? Taking a warm bath or shower. ? Listening to music. ? Using a heating pad on the lower back for pain. If you are directed to use heat:  Place a towel between your skin and the heat source.  Leave the heat on for 20-30 minutes.  Remove the heat if your skin turns bright red. This is especially important if you are unable to feel pain, heat, or cold. You may have a greater risk of getting burned. Get help right away if:  You have painful, regular contractions that are 5 minutes apart or less.  Labor starts before you are [redacted] weeks along in your pregnancy.  You have a fever.  You have   a headache that does not go away.  You have bright red blood coming from your vagina.  You do not feel your baby moving.  You have a sudden onset of: ? Severe headache with vision problems. ? Nausea, vomiting, or diarrhea. ? Chest pain or shortness of breath. These symptoms may be an emergency. If your health care provider recommends that you go to the hospital or birth center where you plan to deliver, do not drive yourself. Have someone else drive you, or call emergency services (911 in the [place] Summary  Labor is your body's natural process of moving your baby, placenta, and umbilical cord out of your uterus.  The process of labor usually starts when your baby is  full-term, between 37 and 40 weeks of pregnancy.  When labor starts, or if your water breaks, call your health care provider or nurse care line. Based on your situation, they will determine when you should go in for an exam. This information is not intended to replace advice given to you by your health care provider. Make sure you discuss any questions you have with your health care provider. Document Revised: 06/07/2017 Document Reviewed: 02/12/2017 Elsevier Patient Education  2020 [employer]  

## 2020-01-24 NOTE — MAU Note (Signed)
Pt signed paper AVS.  

## 2020-01-24 NOTE — MAU Note (Signed)
Presents with c/o abdominal pain, reports pain is "all over".  Denies VB or LOF.  Reports +FM.

## 2020-01-24 NOTE — MAU Provider Note (Signed)
Chief Complaint:  Abdominal Pain   First Provider Initiated Contact with Patient 01/24/20 1325     HPI: Kaylee Gibson is a 34 y.o. G1P0 at [redacted]w[redacted]d who presents to maternity admissions reporting abdominal pain. Symptoms started last night. Reports constant pain throughout her abdomen. Also feels some tightening in her abdomen. Denies n/v/d, dysuria, vaginal bleeding, or LOF. Good fetal movement.   Location: abdomen Quality: tightening Severity: 7/10 in pain scale Duration: 1 day Timing: constant Modifying factors: none Associated signs and symptoms: none  Pregnancy Course: [facility]. Next appointment on 5/10. Fetal pyelectasis.   Past Medical History:  Diagnosis Date  . Eczema    OB History  Gravida Para Term Preterm AB Living  1         0  SAB TAB Ectopic Multiple Live Births               # Outcome Date GA Lbr Len/2nd Weight Sex Delivery Anes PTL Lv  1 Current            Past Surgical History:  Procedure Laterality Date  . LAPAROSCOPIC APPENDECTOMY N/A 11/23/2019   Procedure: APPENDECTOMY LAPAROSCOPIC;  Surgeon: Diamantina Monks, MD;  Location: [facility] OR;  Service: General;  Laterality: N/A;  . NO PAST SURGERIES     Family History  Problem Relation Age of Onset  . Asthma Mother   . Asthma Father   . Brain cancer Brother        Neuroblastoma   . Diabetes Maternal Aunt    Social History   Tobacco Use  . Smoking status: Never Smoker  . Smokeless tobacco: Never Used  Substance Use Topics  . Alcohol use: No    Alcohol/week: 0.0 standard drinks  . Drug use: No   Allergies  Allergen Reactions  . Feraheme [Ferumoxytol] Nausea And Vomiting  . Oxycodone Nausea And Vomiting  . Penicillins Rash    Did it involve swelling of the face/tongue/throat, SOB, or low BP? No Did it involve sudden or severe rash/hives, skin peeling, or any reaction on the inside of your mouth or nose? No Did you need to seek medical attention at a hospital or doctor's office? Yes When did it last  happen?2020 If all above answers are "NO", may proceed with cephalosporin use.    No medications prior to admission.    I have reviewed patient's Past Medical Hx, Surgical Hx, Family Hx, Social Hx, medications and allergies.   ROS:  Review of Systems  Constitutional: Negative.   Gastrointestinal: Positive for abdominal pain. Negative for constipation, diarrhea, nausea and vomiting.  Genitourinary: Negative.     Physical Exam   Patient Vitals for the past 24 hrs:  BP Temp Temp src Pulse Resp SpO2 Height Weight  01/24/20 1418 108/70 -- -- (!) 108 -- -- -- --  01/24/20 1238 110/68 98.4 F (36.9 C) Oral (!) 122 19 100 % -- --  01/24/20 1235 -- -- -- -- -- -- 5\' 1"  (1.549 m) 59.5 kg    Constitutional: Well-developed, well-nourished female in no acute distress.  Cardiovascular: normal rate & rhythm, no murmur Respiratory: normal effort, lung sounds clear throughout GI: Abd soft, non-tender, gravid appropriate for gestational age. Pos BS x 4 MS: Extremities nontender, no edema, normal ROM Neurologic: Alert and oriented x 4.  GU:   NEFG, no blood  Dilation: Closed Exam by:: [email or web address], NP  NST:  Baseline: 145 bpm, Variability: Good {> 6 bpm), Accelerations: Reactive and Decelerations: Absent   Labs:  Results for orders placed or performed during the hospital encounter of 01/24/20 (from the past 24 hour(s))  Urinalysis, Routine w reflex microscopic     Status: None   Collection Time: 01/24/20 12:52 PM  Result Value Ref Range   Color, Urine YELLOW YELLOW   APPearance CLEAR CLEAR   Specific Gravity, Urine 1.016 1.005 - 1.030   pH 6.0 5.0 - 8.0   Glucose, UA NEGATIVE NEGATIVE mg/dL   Hgb urine dipstick NEGATIVE NEGATIVE   Bilirubin Urine NEGATIVE NEGATIVE   Ketones, ur NEGATIVE NEGATIVE mg/dL   Protein, ur NEGATIVE NEGATIVE mg/dL   Nitrite NEGATIVE NEGATIVE   Leukocytes,Ua NEGATIVE NEGATIVE    Imaging:  No results found.  MAU Course: Orders Placed This  Encounter  Procedures  . Urinalysis, Routine w reflex microscopic  . Discharge patient   No orders of the defined types were placed in this encounter.   MDM: Reactive tracing Uterine irritability on monitor Cervix closed/thick Abdomen soft & non tender  Assessment: 1. Abdominal pain during pregnancy in third trimester   2. [redacted] weeks gestation of pregnancy     Plan: Discharge home in stable condition.  Labor precautions   Follow-up Information    Department, [facility] Follow up.   Contact information: [address] [place] [address] [phone]           Allergies as of 01/24/2020      Reactions   Feraheme [ferumoxytol] Nausea And Vomiting   Oxycodone Nausea And Vomiting   Penicillins Rash   Did it involve swelling of the face/tongue/throat, SOB, or low BP? No Did it involve sudden or severe rash/hives, skin peeling, or any reaction on the inside of your mouth or nose? No Did you need to seek medical attention at a hospital or doctor's office? Yes When did it last happen?2020 If all above answers are "NO", may proceed with cephalosporin use.      Medication List    STOP taking these medications   oxyCODONE-acetaminophen 5-325 MG tablet Commonly known as: PERCOCET/ROXICET     TAKE these medications   acetaminophen 325 MG tablet Commonly known as: TYLENOL Take 650 mg by mouth every 6 (six) hours as needed for mild pain or headache.   Prenatal Vitamin 27-0.8 MG Tabs Take 1 Dose by mouth daily.   promethazine 25 MG tablet Commonly known as: PHENERGAN Take 1 tablet (25 mg total) by mouth every 6 (six) hours as needed for nausea.       Jorje Guild, NP 01/24/2020 2:53 PM

## 2020-02-01 LAB — OB RESULTS CONSOLE GC/CHLAMYDIA
Chlamydia: NEGATIVE
Gonorrhea: NEGATIVE

## 2020-02-01 LAB — OB RESULTS CONSOLE GBS: GBS: POSITIVE

## 2020-02-12 ENCOUNTER — Encounter (HOSPITAL_COMMUNITY): Payer: Self-pay | Admitting: Obstetrics and Gynecology

## 2020-02-12 ENCOUNTER — Inpatient Hospital Stay (HOSPITAL_COMMUNITY)
Admission: AD | Admit: 2020-02-12 | Discharge: 2020-02-12 | Disposition: A | Discharge status: Home / Self Care | Payer: Medicaid Other | Attending: Obstetrics and Gynecology | Admitting: Obstetrics and Gynecology

## 2020-02-12 ENCOUNTER — Other Ambulatory Visit: Payer: Self-pay

## 2020-02-12 DIAGNOSIS — O479 False labor, unspecified: Secondary | ICD-10-CM | POA: Insufficient documentation

## 2020-02-12 DIAGNOSIS — Z3A00 Weeks of gestation of pregnancy not specified: Secondary | ICD-10-CM | POA: Diagnosis not present

## 2020-02-12 LAB — URINALYSIS, ROUTINE W REFLEX MICROSCOPIC
Bilirubin Urine: NEGATIVE
Glucose, UA: NEGATIVE mg/dL
Hgb urine dipstick: NEGATIVE
Ketones, ur: NEGATIVE mg/dL
Leukocytes,Ua: NEGATIVE
Nitrite: NEGATIVE
Protein, ur: NEGATIVE mg/dL
Specific Gravity, Urine: 1.004 — ABNORMAL LOW (ref 1.005–1.030)
pH: 7 (ref 5.0–8.0)

## 2020-02-12 NOTE — Discharge Instructions (Signed)
Braxton Hicks Contractions °Contractions of the uterus can occur throughout pregnancy, but they are not always a sign that you are in labor. You may have practice contractions called Braxton Hicks contractions. These false labor contractions are sometimes confused with true labor. °What are Braxton Hicks contractions? °Braxton Hicks contractions are tightening movements that occur in the muscles of the uterus before labor. Unlike true labor contractions, these contractions do not result in opening (dilation) and thinning of the cervix. Toward the end of pregnancy (32-34 weeks), Braxton Hicks contractions can happen more often and may become stronger. These contractions are sometimes difficult to tell apart from true labor because they can be very uncomfortable. You should not feel embarrassed if you go to the hospital with false labor. °Sometimes, the only way to tell if you are in true labor is for your health care provider to look for changes in the cervix. The health care provider will do a physical exam and may monitor your contractions. If you are not in true labor, the exam should show that your cervix is not dilating and your water has not broken. °If there are no other health problems associated with your pregnancy, it is completely safe for you to be sent home with false labor. You may continue to have Braxton Hicks contractions until you go into true labor. °How to tell the difference between true labor and false labor °True labor °· Contractions last 30-70 seconds. °· Contractions become very regular. °· Discomfort is usually felt in the top of the uterus, and it spreads to the lower abdomen and low back. °· Contractions do not go away with walking. °· Contractions usually become more intense and increase in frequency. °· The cervix dilates and gets thinner. °False labor °· Contractions are usually shorter and not as strong as true labor contractions. °· Contractions are usually irregular. °· Contractions  are often felt in the front of the lower abdomen and in the groin. °· Contractions may go away when you walk around or change positions while lying down. °· Contractions get weaker and are shorter-lasting as time goes on. °· The cervix usually does not dilate or become thin. °Follow these instructions at home: ° °· Take over-the-counter and prescription medicines only as told by your health care provider. °· Keep up with your usual exercises and follow other instructions from your health care provider. °· Eat and drink lightly if you think you are going into labor. °· If Braxton Hicks contractions are making you uncomfortable: °? Change your position from lying down or resting to walking, or change from walking to resting. °? Sit and rest in a tub of warm water. °? Drink enough fluid to keep your urine pale yellow. Dehydration may cause these contractions. °? Do slow and deep breathing several times an hour. °· Keep all follow-up prenatal visits as told by your health care provider. This is important. °Contact a health care provider if: °· You have a fever. °· You have continuous pain in your abdomen. °Get help right away if: °· Your contractions become stronger, more regular, and closer together. °· You have fluid leaking or gushing from your vagina. °· You pass blood-tinged mucus (bloody show). °· You have bleeding from your vagina. °· You have low back pain that you never had before. °· You feel your baby’s head pushing down and causing pelvic pressure. °· Your baby is not moving inside you as much as it used to. °Summary °· Contractions that occur before labor are   called Braxton Hicks contractions, false labor, or practice contractions. °· Braxton Hicks contractions are usually shorter, weaker, farther apart, and less regular than true labor contractions. True labor contractions usually become progressively stronger and regular, and they become more frequent. °· Manage discomfort from Braxton Hicks contractions  by changing position, resting in a warm bath, drinking plenty of water, or practicing deep breathing. °This information is not intended to replace advice given to you by your health care provider. Make sure you discuss any questions you have with your health care provider. °Document Revised: 08/20/2017 Document Reviewed: 01/21/2017 °Elsevier Patient Education © 2020 [employer] ° °

## 2020-02-12 NOTE — MAU Note (Signed)
Pt states she started having abdominal pain around 0330-0400 this morning. States it feels constant but gets more intense, describes it as crampy. Denies LOF/VB. Reports good fetal movement. Rating her pain a 10/10.

## 2020-02-26 ENCOUNTER — Other Ambulatory Visit: Payer: Self-pay

## 2020-02-26 ENCOUNTER — Inpatient Hospital Stay (HOSPITAL_COMMUNITY)
Admission: AD | Admit: 2020-02-26 | Discharge: 2020-02-29 | DRG: 806 | Disposition: A | Discharge status: Home / Self Care | Payer: Medicaid Other | Attending: Family Medicine | Admitting: Family Medicine

## 2020-02-26 ENCOUNTER — Encounter (HOSPITAL_COMMUNITY): Payer: Self-pay | Admitting: Obstetrics and Gynecology

## 2020-02-26 DIAGNOSIS — Z3A00 Weeks of gestation of pregnancy not specified: Secondary | ICD-10-CM

## 2020-02-26 DIAGNOSIS — Z20822 Contact with and (suspected) exposure to covid-19: Secondary | ICD-10-CM | POA: Diagnosis present

## 2020-02-26 DIAGNOSIS — Z880 Allergy status to penicillin: Secondary | ICD-10-CM | POA: Diagnosis not present

## 2020-02-26 DIAGNOSIS — Z3A40 40 weeks gestation of pregnancy: Secondary | ICD-10-CM

## 2020-02-26 DIAGNOSIS — O329XX0 Maternal care for malpresentation of fetus, unspecified, not applicable or unspecified: Secondary | ICD-10-CM | POA: Diagnosis not present

## 2020-02-26 DIAGNOSIS — O480 Post-term pregnancy: Principal | ICD-10-CM | POA: Diagnosis present

## 2020-02-26 DIAGNOSIS — O9081 Anemia of the puerperium: Secondary | ICD-10-CM | POA: Diagnosis not present

## 2020-02-26 DIAGNOSIS — O368330 Maternal care for abnormalities of the fetal heart rate or rhythm, third trimester, not applicable or unspecified: Secondary | ICD-10-CM | POA: Diagnosis not present

## 2020-02-26 DIAGNOSIS — O99019 Anemia complicating pregnancy, unspecified trimester: Secondary | ICD-10-CM

## 2020-02-26 DIAGNOSIS — D62 Acute posthemorrhagic anemia: Secondary | ICD-10-CM | POA: Diagnosis not present

## 2020-02-26 DIAGNOSIS — O99824 Streptococcus B carrier state complicating childbirth: Secondary | ICD-10-CM | POA: Diagnosis present

## 2020-02-26 DIAGNOSIS — O368390 Maternal care for abnormalities of the fetal heart rate or rhythm, unspecified trimester, not applicable or unspecified: Secondary | ICD-10-CM

## 2020-02-26 LAB — CBC
HCT: 34.3 % — ABNORMAL LOW (ref 36.0–46.0)
Hemoglobin: 10.5 g/dL — ABNORMAL LOW (ref 12.0–15.0)
MCH: 28.1 pg (ref 26.0–34.0)
MCHC: 30.6 g/dL (ref 30.0–36.0)
MCV: 91.7 fL (ref 80.0–100.0)
Platelets: 383 10*3/uL (ref 150–400)
RBC: 3.74 MIL/uL — ABNORMAL LOW (ref 3.87–5.11)
RDW: 19.7 % — ABNORMAL HIGH (ref 11.5–15.5)
WBC: 5.7 10*3/uL (ref 4.0–10.5)
nRBC: 0 % (ref 0.0–0.2)

## 2020-02-26 LAB — TYPE AND SCREEN
ABO/RH(D): O POS
Antibody Screen: NEGATIVE

## 2020-02-26 LAB — SARS CORONAVIRUS 2 BY RT PCR (HOSPITAL ORDER, PERFORMED IN ~~LOC~~ HOSPITAL LAB): SARS Coronavirus 2: NEGATIVE

## 2020-02-26 MED ORDER — SOD CITRATE-CITRIC ACID 500-334 MG/5ML PO SOLN: 30.0000 mL | ORAL | Status: DC | PRN

## 2020-02-26 MED ORDER — CEFAZOLIN SODIUM-DEXTROSE 2-4 GM/100ML-% IV SOLN
2.0000 g | Freq: Once | INTRAVENOUS | Status: AC
  Administered 2020-02-26: 2 g via INTRAVENOUS
  Filled 2020-02-26: qty 100

## 2020-02-26 MED ORDER — TERBUTALINE SULFATE 1 MG/ML IJ SOLN: 0.2500 mg | Freq: Once | INTRAMUSCULAR | Status: DC | PRN

## 2020-02-26 MED ORDER — OXYTOCIN BOLUS FROM INFUSION
500.0000 mL | Freq: Once | INTRAVENOUS | Status: AC
  Administered 2020-02-27: 500 mL via INTRAVENOUS

## 2020-02-26 MED ORDER — FENTANYL CITRATE (PF) 100 MCG/2ML IJ SOLN
50.0000 ug | INTRAMUSCULAR | Status: DC | PRN
  Administered 2020-02-26 – 2020-02-27 (×2): 100 ug via INTRAVENOUS
  Filled 2020-02-26 (×2): qty 2

## 2020-02-26 MED ORDER — MISOPROSTOL 25 MCG QUARTER TABLET
25.0000 ug | ORAL_TABLET | ORAL | Status: DC | PRN
  Administered 2020-02-26: 25 ug via VAGINAL
  Filled 2020-02-26: qty 1

## 2020-02-26 MED ORDER — LIDOCAINE HCL (PF) 1 % IJ SOLN: 30.0000 mL | INTRAMUSCULAR | Status: DC | PRN

## 2020-02-26 MED ORDER — LACTATED RINGERS IV SOLN: INTRAVENOUS | Status: DC

## 2020-02-26 MED ORDER — ACETAMINOPHEN 325 MG PO TABS: 650.0000 mg | ORAL_TABLET | ORAL | Status: DC | PRN

## 2020-02-26 MED ORDER — CEFAZOLIN SODIUM-DEXTROSE 1-4 GM/50ML-% IV SOLN
1.0000 g | Freq: Three times a day (TID) | INTRAVENOUS | Status: DC
  Administered 2020-02-26 – 2020-02-27 (×2): 1 g via INTRAVENOUS
  Filled 2020-02-26 (×4): qty 50

## 2020-02-26 MED ORDER — LACTATED RINGERS IV SOLN: 500.0000 mL | INTRAVENOUS | Status: DC | PRN

## 2020-02-26 MED ORDER — ONDANSETRON HCL 4 MG/2ML IJ SOLN
4.0000 mg | Freq: Four times a day (QID) | INTRAMUSCULAR | Status: DC | PRN
  Administered 2020-02-26 – 2020-02-27 (×2): 4 mg via INTRAVENOUS
  Filled 2020-02-26 (×2): qty 2

## 2020-02-26 MED ORDER — OXYTOCIN-SODIUM CHLORIDE 30-0.9 UT/500ML-% IV SOLN
2.5000 [IU]/h | INTRAVENOUS | Status: DC
  Filled 2020-02-26: qty 500

## 2020-02-26 NOTE — H&P (Signed)
Obstetric History and Physical  LILLIEN PETRONIO is a 34 y.o. G1P0 with IUP at [redacted]w[redacted]d presenting for IOL due to fetal HR deceleration. Was at [facility] for ob appointment today & had a prolonged deceleration on the monitor. Fetal tracing reactive in MAU.    Prenatal Course Source of Care: [facility]  with onset of care in first trimester Pregnancy complications or risks: Patient Active Problem List   Diagnosis Date Noted  . S/P laparoscopic appendectomy 11/24/2019  . Acute appendicitis affecting pregnancy 11/23/2019  . Eczema 10/30/2016   She plans to bottle feed She desires Depo-Provera for postpartum contraception.   Prenatal labs and studies: ABO, Rh: --/--/O POS, O POS Performed at [facility], [address], [place], [place] [address]  [phone]) Antibody: NEG (03/04 1550) Rubella:  immune RPR:   negative HBsAg:   negative HIV:   negative GBS: positive 1hr GTT:  99  Genetic screening normal Anatomy US normal, fetal pyelectasis resolved  Prenatal Transfer Tool  Maternal Diabetes: No Genetic Screening: Normal Maternal Ultrasounds/Referrals: Normal and Other: fetal pyelectasis resolved Fetal Ultrasounds or other Referrals:  None Maternal Substance Abuse:  No Significant Maternal Medications:  None Significant Maternal Lab Results: Group B Strep positive  Past Medical History:  Diagnosis Date  . Eczema     Past Surgical History:  Procedure Laterality Date  . APPENDECTOMY    . LAPAROSCOPIC APPENDECTOMY N/A 11/23/2019   Procedure: APPENDECTOMY LAPAROSCOPIC;  Surgeon: Diamantina Monks, MD;  Location: [facility] OR;  Service: General;  Laterality: N/A;  . NO PAST SURGERIES      OB History  Gravida Para Term Preterm AB Living  1         0  SAB TAB Ectopic Multiple Live Births               # Outcome Date GA Lbr Len/2nd Weight Sex Delivery Anes PTL Lv  1 Current             Social History   Socioeconomic History  . Marital status: Single    Spouse name: Not on file  .  Number of children: Not on file  . Years of education: Not on file  . Highest education level: Not on file  Occupational History  . Not on file  Tobacco Use  . Smoking status: Never Smoker  . Smokeless tobacco: Never Used  Substance and Sexual Activity  . Alcohol use: No    Alcohol/week: 0.0 standard drinks  . Drug use: No  . Sexual activity: Yes    Birth control/protection: None  Other Topics Concern  . Not on file  Social History Narrative  . Not on file   Social Determinants of Health   Financial Resource Strain:   . Difficulty of Paying Living Expenses:   Food Insecurity:   . Worried About Programme researcher, broadcasting/film/video in the Last Year:   . Barista in the Last Year:   Transportation Needs:   . Freight forwarder (Medical):   Marland Kitchen Lack of Transportation (Non-Medical):   Physical Activity:   . Days of Exercise per Week:   . Minutes of Exercise per Session:   Stress:   . Feeling of Stress :   Social Connections:   . Frequency of Communication with Friends and Family:   . Frequency of Social Gatherings with Friends and Family:   . Attends Religious Services:   . Active Member of Clubs or Organizations:   . Attends  Club or Organization Meetings:   Marland Kitchen Marital Status:     Family History  Problem Relation Age of Onset  . Asthma Mother   . Asthma Father   . Brain cancer Brother        Neuroblastoma   . Diabetes Maternal Aunt     Medications Prior to Admission  Medication Sig Dispense Refill Last Dose  . acetaminophen (TYLENOL) 325 MG tablet Take 650 mg by mouth every 6 (six) hours as needed for mild pain or headache.   Past Month at Unknown time  . Prenatal Vit-Fe Fumarate-FA (PRENATAL VITAMIN) 27-0.8 MG TABS Take 1 Dose by mouth daily. 90 tablet 2 02/25/2020 at Unknown time  . promethazine (PHENERGAN) 25 MG tablet Take 1 tablet (25 mg total) by mouth every 6 (six) hours as needed for nausea. 30 tablet 1 Past Week at Unknown time    Allergies  Allergen Reactions  .  Feraheme [Ferumoxytol] Nausea And Vomiting  . Oxycodone Nausea And Vomiting  . Penicillins Rash    Did it involve swelling of the face/tongue/throat, SOB, or low BP? No Did it involve sudden or severe rash/hives, skin peeling, or any reaction on the inside of your mouth or nose? No Did you need to seek medical attention at a hospital or doctor's office? Yes When did it last happen?2020 If all above answers are "NO", may proceed with cephalosporin use.     Review of Systems: Negative except for what is mentioned in HPI.  Physical Exam: BP 112/81   Pulse (!) 101   Temp 98.1 F (36.7 C)   Resp 18   LMP 05/09/2019 (Approximate)   SpO2 100%  CONSTITUTIONAL: Well-developed, well-nourished female in no acute distress.  HENT:  Normocephalic, atraumatic, External right and left ear normal. Oropharynx is clear and moist EYES: Conjunctivae and EOM are normal. Pupils are equal, round, and reactive to light. No scleral icterus.  NECK: Normal range of motion, supple, no masses SKIN: Skin is warm and dry. No rash noted. Not diaphoretic. No erythema. No pallor. NEUROLOGIC: Alert and oriented to person, place, and time. Normal reflexes, muscle tone coordination. No cranial nerve deficit noted. PSYCHIATRIC: Normal mood and affect. Normal behavior. Normal judgment and thought content. CARDIOVASCULAR: Normal heart rate noted, regular rhythm RESPIRATORY: Effort and breath sounds normal, no problems with respiration noted ABDOMEN: Soft, nontender, nondistended, gravid. MUSCULOSKELETAL: Normal range of motion. No edema and no tenderness. 2+ distal pulses.  Cervical Exam: Dilation: Fingertip Effacement (%): 50 Presentation: Vertex(Confimred by bedside U/S, Robyne Askew NP) Exam by:: Ginger Morris RN   Presentation: cephalic FHT:  NST:  Baseline: 145 bpm, Variability: Good {> 6 bpm), Accelerations: Reactive and Decelerations: Absent   Pertinent Labs/Studies:   No results found for this or any  previous visit (from the past 24 hour(s)).  Assessment : ACADIA THAMMAVONG is a 34 y.o. G1P0 at [redacted]w[redacted]d being admitted for induction of labor due to fetal deceleration.  Plan: Labor: Induction as ordered.  Analgesia as needed. FWB: Reassuring fetal heart tracing.   GBS positive - PCN allergy, currently trying to find sensitivity results Delivery plan: Hopeful for vaginal delivery   Jorje Guild, NP

## 2020-02-26 NOTE — Progress Notes (Signed)
Patient ID: Kaylee Gibson, female   DOB: 1986/05/14, 34 y.o.   MRN: [id number]  Kaylee Gibson is a 34 y.o. G1P0 at [redacted]w[redacted]d admitted for IOL d/t post-dates.  Subjective: Felt nauseous and had some vomiting after fentanyl.  Just got zofran.  Feeling contractions.  Agreeable to foley bulb placement.    Objective: BP 127/69   Pulse (!) 105   Temp 97.7 F (36.5 C) (Oral)   Resp 16   Ht 5\' 1"  (1.549 m)   Wt 60.2 kg   LMP 05/09/2019 (Approximate)   SpO2 100%   BMI 25.08 kg/m  No intake/output data recorded.  FHT:  FHR: 135 bpm, variability: moderate,  accelerations:  Present,  decelerations:  Present intermittent variables UC:   regular, every 2-3 minutes  SVE:   Dilation: 1.5 Effacement (%): 50 Station: -3 Exam by:: [email or web address], MD  Labs: Lab Results  Component Value Date   WBC 5.7 02/26/2020   HGB 10.5 (L) 02/26/2020   HCT 34.3 (L) 02/26/2020   MCV 91.7 02/26/2020   PLT 383 02/26/2020    Assessment / Plan: Kaylee Gibson is a 34 y.o. G1P0 at [redacted]w[redacted]d admitted for IOL d/t post-dates.  Labor: Progressing well.  Foley bulb placed this check at 2145.   Fetal Wellbeing:  Category II Pain Control:  IV pain meds, epidural upon request I/D:  GBS positive - ancef Anticipated MOD:  vaginal delivery  Trish Mancinelli [id number], MD PGY-2 Resident Family Medicine 02/26/2020, 9:49 PM

## 2020-02-26 NOTE — Progress Notes (Addendum)
   Kaylee Gibson is a 34 y.o. G1P0 at [redacted]w[redacted]d  admitted for induction of labor due to Post dates. Due date 02/25/2020.  Subjective:  Patient is comfortable, but feeling contractions intermittently.  Objective: Vitals:   02/26/20 1353 02/26/20 1518 02/26/20 1820  BP: 112/81 109/77 118/72  Pulse: (!) 101 (!) 104 93  Resp: 18 16 14   Temp: 98.1 F (36.7 C) 98.2 F (36.8 C)   TempSrc:  Oral   SpO2: 100%    Weight:  60.2 kg   Height:  5\' 1"  (1.549 m)    No intake/output data recorded.  FHT:  FHR: 145 bpm, variability: moderate,  accelerations:  Present,  decelerations:  Absent UC:  Irregular, very frequently SVE:   Dilation: Fingertip Effacement (%): Thin Station: Ballotable Exam by:: , CNM Labs: Lab Results  Component Value Date   WBC 5.7 02/26/2020   HGB 10.5 (L) 02/26/2020   HCT 34.3 (L) 02/26/2020   MCV 91.7 02/26/2020   PLT 383 02/26/2020    Assessment / Plan: Labor is progressing; patient received 1 dose of vaginal cytotec at 1530; hold cytotec until fetal tracing is appropriate. Attempt foley bulb at next exam. Patient verbalized understanding and had no questions.   Patient will get ANCEF every 8 hours; 2 grams already given.  Labor:  early labor Fetal Wellbeing:  Category I Pain Control:  Labor support without medications Anticipated MOD:  NSVD  04/27/2020 02/26/2020, 7:29 PM  CNM attestation:  I have seen and examined this patient and agree with above documentation in the med student's note.   Patient contracting too much to give cytotec right now; will hold under Cat 1. Night team to place FB.    Joyce Gross, CNM 02/29/2020 2:49 PM

## 2020-02-26 NOTE — Progress Notes (Signed)

## 2020-02-26 NOTE — MAU Note (Signed)
.   Kaylee Gibson is a 34 y.o. at [redacted]w[redacted]d here in MAU reporting: she was sent from office for monitoring. [facility]ed [facility] and she stated pt had a 3 min dcel in the office. Pt reports +FM  Onset of complaint: at [facility] appointment today Pain score: 0 Vitals:   02/26/20 1353  BP: 112/81  Pulse: (!) 101  Resp: 18  Temp: 98.1 F (36.7 C)  SpO2: 100%     FHT:140 Lab orders placed from triage:

## 2020-02-26 NOTE — H&P (Addendum)
OBSTETRIC ADMISSION HISTORY AND PHYSICAL  Kaylee Gibson is a 34 y.o. female G1P0 with IUP at [redacted]w[redacted]d best by U/S presenting for IOL 2/2 non-reassuring NST. She reports +FMs, No LOF, no VB, no blurry vision, headaches or peripheral edema, and RUQ pain.  She plans on formula feeding. She request Depo for birth control. She received her prenatal care at [facility] Dating: best by U/S --->  Estimated Date of Delivery: 02/25/20  Sono:    @[redacted]w[redacted]d , CWD, normal anatomy, cephalic presentation, 1767 gm   3 lb 14 oz      30 % EFW   Prenatal History/Complications: GBS Positive status, need to assess sensitivities for intrapartum propylaxis  Past Medical History: Past Medical History:  Diagnosis Date   Eczema     Past Surgical History: Past Surgical History:  Procedure Laterality Date   APPENDECTOMY     LAPAROSCOPIC APPENDECTOMY N/A 11/23/2019   Procedure: APPENDECTOMY LAPAROSCOPIC;  Surgeon: 01/23/2020, MD;  Location: [facility] OR;  Service: General;  Laterality: N/A;   NO PAST SURGERIES      Obstetrical History: OB History     Gravida  1   Para      Term      Preterm      AB      Living  0      SAB      TAB      Ectopic      Multiple      Live Births              Social History Social History   Socioeconomic History   Marital status: Single    Spouse name: Not on file   Number of children: Not on file   Years of education: Not on file   Highest education level: Not on file  Occupational History   Not on file  Tobacco Use   Smoking status: Never Smoker   Smokeless tobacco: Never Used  Substance and Sexual Activity   Alcohol use: No    Alcohol/week: 0.0 standard drinks   Drug use: No   Sexual activity: Yes    Birth control/protection: None  Other Topics Concern   Not on file  Social History Narrative   Not on file   Social Determinants of Health   Financial Resource Strain:    Difficulty of Paying Living Expenses:   Food  Insecurity:    Worried About Diamantina Monks in the Last Year:    Programme researcher, broadcasting/film/video in the Last Year:   Transportation Needs:    Barista (Medical):    Lack of Transportation (Non-Medical):   Physical Activity:    Days of Exercise per Week:    Minutes of Exercise per Session:   Stress:    Feeling of Stress :   Social Connections:    Frequency of Communication with Friends and Family:    Frequency of Social Gatherings with Friends and Family:    Attends Religious Services:    Active Member of Clubs or Organizations:    Attends Freight forwarder:    Marital Status:     Family History: Family History  Problem Relation Age of Onset   Asthma Mother    Asthma Father    Brain cancer Brother        Neuroblastoma    Diabetes Maternal Aunt     Allergies: Allergies  Allergen Reactions   Feraheme [Ferumoxytol] Nausea And Vomiting  Oxycodone Nausea And Vomiting   Penicillins Rash    Did it involve swelling of the face/tongue/throat, SOB, or low BP? No Did it involve sudden or severe rash/hives, skin peeling, or any reaction on the inside of your mouth or nose? No Did you need to seek medical attention at a hospital or doctor's office? Yes When did it last happen?  2020     If all above answers are "NO", may proceed with cephalosporin use.     Medications Prior to Admission  Medication Sig Dispense Refill Last Dose   acetaminophen (TYLENOL) 325 MG tablet Take 650 mg by mouth every 6 (six) hours as needed for mild pain or headache.   Past Month at Unknown time   Prenatal Vit-Fe Fumarate-FA (PRENATAL VITAMIN) 27-0.8 MG TABS Take 1 Dose by mouth daily. 90 tablet 2 02/25/2020 at Unknown time   promethazine (PHENERGAN) 25 MG tablet Take 1 tablet (25 mg total) by mouth every 6 (six) hours as needed for nausea. 30 tablet 1 Past Week at Unknown time     Review of Systems   All systems reviewed and negative except as stated in HPI  Blood pressure 112/81,  pulse (!) 101, temperature 98.1 F (36.7 C), resp. rate 18, last menstrual period 05/09/2019, SpO2 100 %. General appearance: alert and no distress Lungs: clear to auscultation bilaterally Heart: regular rate and rhythm Abdomen:deferred  Pelvic: gravid Extremities: no swelling and no pitting edema DTR's deferred Presentation: cephalic Fetal monitoring:  Baseline HR: 135 Bpm, Accelerations present, good variability, no decelerations Uterine activity: Irregular, 20-30 seconds long every 2 minutes Dilation: Fingertip Effacement (%): 50 Exam by:: Ginger Morris RN   Prenatal labs: ABO, Rh: --/--/O POS, O POS Performed at [facility], [place] [address] [place], [place] [address]  [phone]) Antibody: NEG (03/04 1550) Rubella: Immune RPR: Non-reactive  HBsAg: Non-reactive HIV: Non-reactive  GBS: positive 1 hr Glucola 99 Genetic screening: Negative Quad Screen, negative CF screen Anatomy US normal   Prenatal Transfer Tool  Maternal Diabetes: No Genetic Screening: Normal Maternal Ultrasounds/Referrals: Normal Fetal Ultrasounds or other Referrals:  None Maternal Substance Abuse:  No Significant Maternal Medications:  None Significant Maternal Lab Results: Group B Strep positive  No results found for this or any previous visit (from the past 24 hour(s)).  Patient Active Problem List   Diagnosis Date Noted   S/P laparoscopic appendectomy 11/24/2019   Acute appendicitis affecting pregnancy 11/23/2019   Eczema 10/30/2016    Assessment/Plan:  Kaylee Gibson is a 34 y.o. G1P0 at [redacted]w[redacted]d here for IOL due to non-reassuring NST  #Labor: Start Cytotec for cervical ripening  #Pain: As pt. requests #FWB:  Baseline HR: 135 Bpm, Accelerations present, good variability, no decelerations, contractions last 20-3- seconds every 2 minutes (irregular) #ID: GBS positive, test for antimicrobial sensitivities due to penicillin allergy. Ancef has been initiated #MOF: Formula #MOC:  Depo #Circ:  yes  Ok Edwards, Medical Student  02/26/2020, 2:49 PM   Patient seen and examined; agree with the above documentation and findings.  Maye Hides

## 2020-02-26 NOTE — Progress Notes (Addendum)
   Kaylee Gibson is a 34 y.o. G1P0 at [redacted]w[redacted]d  admitted for induction of labor due to Post dates. Due date 02/25/2020.  Subjective:  Patient feeling good fetal movements; no complaints.  Objective: Vitals:   02/26/20 1353 02/26/20 1518  BP: 112/81 109/77  Pulse: (!) 101 (!) 104  Resp: 18 16  Temp: 98.1 F (36.7 C) 98.2 F (36.8 C)  TempSrc:  Oral  SpO2: 100%   Weight:  60.2 kg  Height:  5\' 1"  (1.549 m)   No intake/output data recorded.  FHT:  FHR: 135 bpm, variability: moderate,  accelerations:  Present,  decelerations:  Absent UC:   irregular, every 2-3 minutes SVE:   Dilation: Fingertip Effacement (%): 50 Station: Ballotable Exam by:: [email or web address], RNC Pitocin @  mu/min Labs: Lab Results  Component Value Date   WBC 5.7 02/26/2020   HGB 10.5 (L) 02/26/2020   HCT 34.3 (L) 02/26/2020   MCV 91.7 02/26/2020   PLT 383 02/26/2020    Assessment / Plan: early labor; patient received 1 dose of vaginal cytotec at 1530; will plan for another at 1930.  Will attempt FB when she is a bit more dilated.  Long discussion with patient about steps to induction (cytotec, FB, pitocin) and prepared patient for the length of time; it is normal  For a first time mom to stay on L and D for 2-3 days. Gave a realistic expectation of the timeline and medications used (cytotec, pitocin) and their methods of action;  Patient verbalized understand and had no questions.   Patient will get ANCEF every 8 hours; 2 grams already given.  Labor: early labor Fetal Wellbeing:  Category I Pain Control:  Labor support without medications Anticipated MOD:  NSVD  04/27/2020 02/26/2020, 4:24 PM

## 2020-02-27 ENCOUNTER — Inpatient Hospital Stay (HOSPITAL_COMMUNITY): Payer: Medicaid Other | Admitting: Anesthesiology

## 2020-02-27 ENCOUNTER — Encounter (HOSPITAL_COMMUNITY): Payer: Self-pay | Admitting: Student

## 2020-02-27 ENCOUNTER — Ambulatory Visit: Payer: Medicaid Other

## 2020-02-27 DIAGNOSIS — O368330 Maternal care for abnormalities of the fetal heart rate or rhythm, third trimester, not applicable or unspecified: Secondary | ICD-10-CM

## 2020-02-27 DIAGNOSIS — O480 Post-term pregnancy: Secondary | ICD-10-CM

## 2020-02-27 DIAGNOSIS — D62 Acute posthemorrhagic anemia: Secondary | ICD-10-CM

## 2020-02-27 DIAGNOSIS — Z3A40 40 weeks gestation of pregnancy: Secondary | ICD-10-CM

## 2020-02-27 DIAGNOSIS — O99824 Streptococcus B carrier state complicating childbirth: Secondary | ICD-10-CM

## 2020-02-27 DIAGNOSIS — O99019 Anemia complicating pregnancy, unspecified trimester: Secondary | ICD-10-CM

## 2020-02-27 LAB — RPR: RPR Ser Ql: NONREACTIVE

## 2020-02-27 MED ORDER — DIPHENHYDRAMINE HCL 25 MG PO CAPS: 25.0000 mg | ORAL_CAPSULE | Freq: Four times a day (QID) | ORAL | Status: DC | PRN

## 2020-02-27 MED ORDER — PHENYLEPHRINE 40 MCG/ML (10ML) SYRINGE FOR IV PUSH (FOR BLOOD PRESSURE SUPPORT): 80.0000 ug | PREFILLED_SYRINGE | INTRAVENOUS | Status: DC | PRN

## 2020-02-27 MED ORDER — FENTANYL-BUPIVACAINE-NACL 0.5-0.125-0.9 MG/250ML-% EP SOLN
EPIDURAL | Status: AC
  Filled 2020-02-27: qty 250

## 2020-02-27 MED ORDER — ZOLPIDEM TARTRATE 5 MG PO TABS: 5.0000 mg | ORAL_TABLET | Freq: Every evening | ORAL | Status: DC | PRN

## 2020-02-27 MED ORDER — ONDANSETRON HCL 4 MG PO TABS: 4.0000 mg | ORAL_TABLET | ORAL | Status: DC | PRN

## 2020-02-27 MED ORDER — FERROUS SULFATE 325 (65 FE) MG PO TABS
325.0000 mg | ORAL_TABLET | Freq: Every day | ORAL | Status: DC
  Administered 2020-02-28 – 2020-02-29 (×2): 325 mg via ORAL
  Filled 2020-02-27 (×2): qty 1

## 2020-02-27 MED ORDER — IBUPROFEN 600 MG PO TABS
600.0000 mg | ORAL_TABLET | Freq: Four times a day (QID) | ORAL | Status: DC
  Administered 2020-02-27 – 2020-02-29 (×8): 600 mg via ORAL
  Filled 2020-02-27 (×8): qty 1

## 2020-02-27 MED ORDER — LACTATED RINGERS IV SOLN
500.0000 mL | Freq: Once | INTRAVENOUS | Status: AC
  Administered 2020-02-27: 500 mL via INTRAVENOUS

## 2020-02-27 MED ORDER — SENNOSIDES-DOCUSATE SODIUM 8.6-50 MG PO TABS
2.0000 | ORAL_TABLET | ORAL | Status: DC
  Administered 2020-02-27 – 2020-02-28 (×2): 2 via ORAL
  Filled 2020-02-27 (×2): qty 2

## 2020-02-27 MED ORDER — SODIUM CHLORIDE (PF) 0.9 % IJ SOLN
INTRAMUSCULAR | Status: DC | PRN
  Administered 2020-02-27: 12 mL/h via EPIDURAL

## 2020-02-27 MED ORDER — DIPHENHYDRAMINE HCL 50 MG/ML IJ SOLN: 12.5000 mg | INTRAMUSCULAR | Status: DC | PRN

## 2020-02-27 MED ORDER — ACETAMINOPHEN 325 MG PO TABS
650.0000 mg | ORAL_TABLET | ORAL | Status: DC | PRN
  Administered 2020-02-27 – 2020-02-28 (×3): 650 mg via ORAL
  Filled 2020-02-27 (×3): qty 2

## 2020-02-27 MED ORDER — OXYTOCIN-SODIUM CHLORIDE 30-0.9 UT/500ML-% IV SOLN
1.0000 m[IU]/min | INTRAVENOUS | Status: DC
  Administered 2020-02-27: 2 m[IU]/min via INTRAVENOUS

## 2020-02-27 MED ORDER — SIMETHICONE 80 MG PO CHEW: 80.0000 mg | CHEWABLE_TABLET | ORAL | Status: DC | PRN

## 2020-02-27 MED ORDER — LIDOCAINE HCL (PF) 1 % IJ SOLN
INTRAMUSCULAR | Status: DC | PRN
  Administered 2020-02-27: 6 mL via EPIDURAL

## 2020-02-27 MED ORDER — BENZOCAINE-MENTHOL 20-0.5 % EX AERO
1.0000 "application " | INHALATION_SPRAY | CUTANEOUS | Status: DC | PRN
  Administered 2020-02-27: 1 via TOPICAL
  Filled 2020-02-27: qty 56

## 2020-02-27 MED ORDER — FENTANYL-BUPIVACAINE-NACL 0.5-0.125-0.9 MG/250ML-% EP SOLN: 12.0000 mL/h | EPIDURAL | Status: DC | PRN

## 2020-02-27 MED ORDER — EPHEDRINE 5 MG/ML INJ: 10.0000 mg | INTRAVENOUS | Status: DC | PRN

## 2020-02-27 MED ORDER — MISOPROSTOL 50MCG HALF TABLET
50.0000 ug | ORAL_TABLET | ORAL | Status: DC
  Administered 2020-02-27: 50 ug via BUCCAL
  Filled 2020-02-27: qty 1

## 2020-02-27 MED ORDER — COCONUT OIL OIL: 1.0000 "application " | TOPICAL_OIL | Status: DC | PRN

## 2020-02-27 MED ORDER — TERBUTALINE SULFATE 1 MG/ML IJ SOLN: 0.2500 mg | Freq: Once | INTRAMUSCULAR | Status: DC | PRN

## 2020-02-27 MED ORDER — WITCH HAZEL-GLYCERIN EX PADS: 1.0000 "application " | MEDICATED_PAD | CUTANEOUS | Status: DC | PRN

## 2020-02-27 MED ORDER — ONDANSETRON HCL 4 MG/2ML IJ SOLN: 4.0000 mg | INTRAMUSCULAR | Status: DC | PRN

## 2020-02-27 MED ORDER — DIBUCAINE (PERIANAL) 1 % EX OINT: 1.0000 "application " | TOPICAL_OINTMENT | CUTANEOUS | Status: DC | PRN

## 2020-02-27 MED ORDER — METOCLOPRAMIDE HCL 5 MG/ML IJ SOLN
10.0000 mg | Freq: Once | INTRAMUSCULAR | Status: AC
  Administered 2020-02-27: 10 mg via INTRAVENOUS
  Filled 2020-02-27: qty 2

## 2020-02-27 MED ORDER — TETANUS-DIPHTH-ACELL PERTUSSIS 5-2.5-18.5 LF-MCG/0.5 IM SUSP: 0.5000 mL | Freq: Once | INTRAMUSCULAR | Status: DC

## 2020-02-27 NOTE — Progress Notes (Signed)
Patient ID: Kaylee Gibson, female   DOB: 04-28-1986, 34 y.o.   MRN: [id number]  Kaylee Gibson is a 34 y.o. G1P0 at [redacted]w[redacted]d admitted for IOL d/t post-dates.  Subjective: Feeling more intense contractions.  FB still in.    Objective: BP 115/72   Pulse (!) 102   Temp 98.4 F (36.9 C) (Oral)   Resp 16   Ht 5\' 1"  (1.549 m)   Wt 60.2 kg   LMP 05/09/2019 (Approximate)   SpO2 100%   BMI 25.08 kg/m  Total I/O In: -  Out: 1 [Emesis/NG output:1]  FHT:  FHR: 130 bpm, variability: moderate,  accelerations:  Present,  decelerations:  Absent UC:   Irregular  SVE:   Dilation: 1.5 Effacement (%): 50 Station: -3 Exam by:: [email or web address] MD  Labs: Lab Results  Component Value Date   WBC 5.7 02/26/2020   HGB 10.5 (L) 02/26/2020   HCT 34.3 (L) 02/26/2020   MCV 91.7 02/26/2020   PLT 383 02/26/2020    Assessment / Plan: Kaylee Gibson is a 34 y.o. G1P0 at [redacted]w[redacted]d admitted for IOL d/t post-dates.  Labor: Foley bulb still in place.  Reconfirmed vertex with bedside ultrasound.  Given cytotec #2 at 0050 Fetal Wellbeing:  Category I Pain Control:  IV pain meds, epidural upon request I/D:  GBS positive - ancef Anticipated MOD:  vaginal delivery   Kaylee Gibson [redacted]w[redacted]d, MD PGY-2 Resident Family Medicine 02/27/2020, 1:05 AM

## 2020-02-27 NOTE — Progress Notes (Signed)
Patient ID: Kaylee Gibson, female   DOB: [birth year], 34 y.o.   MRN: [id number]  Kaylee Boatman Parkeris a 34 y.o.G1P0 at [redacted]w[redacted]d admitted for IOL d/t post-dates.  Subjective: Still very uncomfortable with intense contractions.  Occasionally feeling the urge to push.  Declines epidural.   Objective: BP 117/74   Pulse 84   Temp 98.4 F (36.9 C) (Oral)   Resp 18   Ht 5\' 1"  (1.549 m)   Wt 60.2 kg   LMP 05/09/2019 (Approximate)   SpO2 100%   BMI 25.08 kg/m  Total I/O In: -  Out: 1 [Emesis/NG output:1]  FHT:  FHR: 140 bpm, variability: moderate,  accelerations:  Present,  decelerations:  Present intermittent variables/earlies UC:   irregular, every 2-4 minutes  SVE:   9/100/+1 Labs: Lab Results  Component Value Date   WBC 5.7 02/26/2020   HGB 10.5 (L) 02/26/2020   HCT 34.3 (L) 02/26/2020   MCV 91.7 02/26/2020   PLT 383 02/26/2020    Assessment / Plan: Kaylee Dorrance Parkeris a 34 y.o.G1P0 at 101w2d admitted for IOL d/t post-dates.  Labor:Unchanged from last check.  Continue expectant management.  Fetal Wellbeing:CategoryII Pain Control:IV pain meds I/D:GBS positive - ancef Anticipated [redacted]w[redacted]d delivery   Kaylee Gibson VIF:[id number], MD PGY-2 Resident Family Medicine 02/27/2020, 5:44 AM

## 2020-02-27 NOTE — Anesthesia Procedure Notes (Signed)
Epidural Patient location during procedure: OB Start time: 02/27/2020 10:38 AM End time: 02/27/2020 11:00 AM  Staffing Anesthesiologist: Trevor Iha, MD Performed: anesthesiologist   Preanesthetic Checklist Completed: patient identified, IV checked, site marked, risks and benefits discussed, surgical consent, monitors and equipment checked, pre-op evaluation and timeout performed  Epidural Patient position: sitting Prep: DuraPrep and site prepped and draped Patient monitoring: continuous pulse ox and blood pressure Approach: midline Location: L3-L4 Injection technique: LOR air  Needle:  Needle type: Tuohy  Needle gauge: 17 G Needle length: 9 cm and 9 Needle insertion depth: 8 cm Catheter type: closed end flexible Catheter size: 19 Gauge Catheter at skin depth: 14 cm Test dose: negative  Assessment Events: blood not aspirated, injection not painful, no injection resistance, no paresthesia and negative IV test  Additional Notes Patient identified. Risks/Benefits/Options discussed with patient including but not limited to bleeding, infection, nerve damage, paralysis, failed block, incomplete pain control, headache, blood pressure changes, nausea, vomiting, reactions to medication both or allergic, itching and postpartum back pain. Confirmed with bedside nurse the patient's most recent platelet count. Confirmed with patient that they are not currently taking any anticoagulation, have any bleeding history or any family history of bleeding disorders. Patient expressed understanding and wished to proceed. All questions were answered. Sterile technique was used throughout the entire procedure. Please see nursing notes for vital signs. Test dose was given through epidural needle and negative prior to continuing to dose epidural or start infusion. Warning signs of high block given to the patient including shortness of breath, tingling/numbness in hands, complete motor block, or any  concerning symptoms with instructions to call for help. Patient was given instructions on fall risk and not to get out of bed. All questions and concerns addressed with instructions to call with any issues.  2 Attempt (S) . Due to Patient movement and lordosis w contractions.  Patient tolerated procedure well.

## 2020-02-27 NOTE — Progress Notes (Addendum)
LABOR PROGRESS NOTE  Kaylee Gibson is a 34 y.o. G1P0 at [redacted]w[redacted]d  admitted for IOL due to post dates.  Subjective: Patient remains comfortable and pushing with good maternal effort.   Objective: BP 121/78   Pulse 96   Temp 98.4 F (36.9 C) (Oral)   Resp 17   Ht 5\' 1"  (1.549 m)   Wt 60.2 kg   LMP 05/09/2019 (Approximate)   SpO2 100%   BMI 25.08 kg/m  or  Vitals:   02/27/20 1138 02/27/20 1200 02/27/20 1231 02/27/20 1300  BP: (!) 98/59 (!) 105/59 (!) 146/74 121/78  Pulse: 94 96 (!) 117 96  Resp:      Temp:    98.4 F (36.9 C)  TempSrc:    Oral  SpO2:      Weight:      Height:        Dilation: 10 Dilation Complete Date: 02/27/20 Dilation Complete Time: 0617 Effacement (%): 100 Cervical Position: Anterior Station: Plus 2, Plus 3 Presentation: Vertex Exam by:: stone rnc  FHT: baseline rate 150, moderate varibility, positive acel, early decel Toco: every 3 mins  Labs: Lab Results  Component Value Date   WBC 5.7 02/26/2020   HGB 10.5 (L) 02/26/2020   HCT 34.3 (L) 02/26/2020   MCV 91.7 02/26/2020   PLT 383 02/26/2020    Patient Active Problem List   Diagnosis Date Noted  . Indication for care in labor and delivery, antepartum 02/26/2020  . S/P laparoscopic appendectomy 11/24/2019  . Acute appendicitis affecting pregnancy 11/23/2019  . Eczema 10/30/2016    Assessment / Plan: 34 y.o. G1P0 at [redacted]w[redacted]d here for IOL 2/2 post dates.  Labor: no change in descent, evaluated for VAVD, patient agreeable to this plan and consented Fetal Wellbeing:  Category I, early decelerations  Pain Control: epidural  GBS: ppx with Ancef, adequate  Anticipated MOD:  Vaginal delivery    [redacted]w[redacted]d, MD Family Medicine, PGY-1  02/27/2020, 1:16 PM

## 2020-02-27 NOTE — Progress Notes (Signed)
Patient ID: Kaylee Gibson, female   DOB: 1985-10-22, 34 y.o.   MRN: [id number]  Kaylee Hamblin Parkeris a 34 y.o.G1P0 at [redacted]w[redacted]d admitted for IOL d/t post-dates.  Subjective: Very uncomfortable with ctx but not pushing effectively.   Objective: BP 137/77    Pulse (!) 119    Temp (!) 97.5 F (36.4 C) (Axillary)    Resp 17    Ht 5\' 1"  (1.549 m)    Wt 60.2 kg    LMP 05/09/2019 (Approximate)    SpO2 100%    BMI 25.08 kg/m  No intake/output data recorded.  FHT:  FHR: 145 bpm, variability: moderate,  accelerations:  Present,  decelerations:  Present intermittent variables/earlies UC:   irregular, every 2-3 minutes  SVE:   9/100/+1 Labs: Lab Results  Component Value Date   WBC 5.7 02/26/2020   HGB 10.5 (L) 02/26/2020   HCT 34.3 (L) 02/26/2020   MCV 91.7 02/26/2020   PLT 383 02/26/2020    Assessment / Plan: Kaylee Timoney Parkeris a 34 y.o.G1P0 at [redacted]w[redacted]d admitted for IOL d/t post-dates.  Labor:Complete and pushing. Will labor down or leave patient be for a little bit as not pushing effectively or really attempting to push at all. Moves baby well when pushing for about 2 seconds but then stops. Will consider VAVD if fetal tracing changes. Anticipate SVD. Fetal Wellbeing:CategoryII; reassuring for moderate variability and occasional accels Pain Control:IV pain meds I/D:GBS positive - ancef Anticipated [redacted]w[redacted]d delivery   PFX:[id number], MD 02/27/2020, 9:12 AM

## 2020-02-27 NOTE — Progress Notes (Signed)
Patient ID: Kaylee Gibson, female   DOB: November 28, 1985, 34 y.o.   MRN: [id number]  Kaylee Gibson a 34 y.o.G1P0 at [redacted]w[redacted]d admitted for IOL d/t post-dates.  Subjective: Foley bulb out.  Feeling much more intense contractions.  Desires AROM.   Objective: BP 117/74    Pulse 84    Temp 98.4 F (36.9 C) (Oral)    Resp 18    Ht 5\' 1"  (1.549 m)    Wt 60.2 kg    LMP 05/09/2019 (Approximate)    SpO2 100%    BMI 25.08 kg/m  Total I/O In: -  Out: 1 [Emesis/NG output:1]  FHT:  FHR: 130 bpm, variability: moderate,  accelerations:  Present,  decelerations:  Present intermittent variables and earlies UC:   regular, every 1-3 minutes  SVE:   9/100/+1 Labs: Lab Results  Component Value Date   WBC 5.7 02/26/2020   HGB 10.5 (L) 02/26/2020   HCT 34.3 (L) 02/26/2020   MCV 91.7 02/26/2020   PLT 383 02/26/2020    Assessment / Plan: Kaylee Gibson a 34 y.o.G1P0 at [redacted]w[redacted]d admitted for IOL d/t post-dates.  Labor:9 cm dilated at this check.  AROMed with heavy meconium. Fetal Wellbeing:Category II Pain Control:IV pain meds I/D:GBS positive - ancef Anticipated [redacted]w[redacted]d delivery   Kaylee Gibson MCR:[id number], MD PGY-2 Resident Family Medicine 02/27/2020, 4:31 AM

## 2020-02-27 NOTE — Discharge Summary (Addendum)
Postpartum Discharge Summary   Patient Name: Kaylee Gibson DOB: Oct 08, 1985 MRN: [id number]  Date of admission: 02/26/2020 Delivery date:02/27/2020  Delivering provider: Chauncey Mann  Date of discharge: 02/29/2020  Admitting diagnosis: Indication for care in labor and delivery, antepartum [O75.9] Intrauterine pregnancy: [redacted]w[redacted]d    Secondary diagnosis:  Active Problems:   Indication for care in labor and delivery, antepartum   Vacuum-assisted vaginal delivery   Acute blood loss anemia   Anemia of pregnancy  Additional problems: None    Discharge diagnosis: Term Pregnancy Delivered                                              Post partum procedures:None Augmentation: AROM, Pitocin, Cytotec and IP [facility] Induction of Labor With Vaginal Delivery   34y.o. yo G1P0 at 469w2das admitted to the hospital 02/26/2020 for induction of labor.  Indication for induction: Postdates and decel on NST.  Patient had an uncomplicated labor course as follows: Initial SVE: 0.5/50/ballotable. Patient received Foley balloon, Cytotec, AROM and Pitocin. She progressed to complete and pushed intermittently without much success. She eventually received Epidural and began pushing again however with little progress. Patient complete for > 8 hours and pushed ineffectively for most of this time. Decision made to proceed with Vacuum-Assistance. Membrane Rupture Time/Date: 4:25 AM ,02/27/2020   Delivery Method:Vaginal, Vacuum (Extractor)  Episiotomy: None  Lacerations:  2nd degree;Labial  Details of delivery can be found in separate delivery note. PO iron initiated. Patient had a routine postpartum course. Patient is discharged home 02/29/20.  Newborn Data: Birth date:02/27/2020  Birth time:1:37 PM  Gender:Female  Living status:Living  Apgars:9 ,9  Weight:3060 g   Magnesium Sulfate received: No BMZ received: No Rhophylac:N/A MMR:N/A T-DaP:Given prenatally Flu:  No Transfusion:No  Physical exam  Vitals:   02/27/20 1300 02/27/20 1343 02/27/20 1345 02/27/20 1400  BP: 121/78 100/76 118/83 119/70  Pulse: 96 90 (!) 115 (!) 116  Resp:   16   Temp: 98.4 F (36.9 C)     TempSrc: Oral     SpO2:      Weight:      Height:       General: alert, cooperative and no distress Lochia: appropriate Uterine Fundus: firm Incision: N/A DVT Evaluation: No evidence of DVT seen on physical exam. Labs: Lab Results  Component Value Date   WBC 5.7 02/26/2020   HGB 10.5 (L) 02/26/2020   HCT 34.3 (L) 02/26/2020   MCV 91.7 02/26/2020   PLT 383 02/26/2020   CMP Latest Ref Rng & Units 11/24/2019  Glucose 70 - 99 mg/dL 108(H)  BUN 6 - 20 mg/dL 5(L)  Creatinine 0.44 - 1.00 mg/dL 0.64  Sodium 135 - 145 mmol/L 136  Potassium 3.5 - 5.1 mmol/L 3.7  Chloride 98 - 111 mmol/L 102  CO2 22 - 32 mmol/L 23  Calcium 8.9 - 10.3 mg/dL 8.5(L)  Total Protein 6.5 - 8.1 g/dL -  Total Bilirubin 0.3 - 1.2 mg/dL -  Alkaline Phos 38 - 126 U/L -  AST 15 - 41 U/L -  ALT 0 - 44 U/L -   Edinburgh Score: No flowsheet data found.   After visit meds:  Allergies as of 02/29/2020      Reactions   Feraheme [ferumoxytol] Nausea And Vomiting   Oxycodone Nausea And  Vomiting   Penicillins Rash   Did it involve swelling of the face/tongue/throat, SOB, or low BP? No Did it involve sudden or severe rash/hives, skin peeling, or any reaction on the inside of your mouth or nose? No Did you need to seek medical attention at a hospital or doctor's office? Yes When did it last happen?2020 If all above answers are "NO", may proceed with cephalosporin use.      Medication List    TAKE these medications   acetaminophen 325 MG tablet Commonly known as: TYLENOL Take 650 mg by mouth every 6 (six) hours as needed for mild pain or headache.   ibuprofen 600 MG tablet Commonly known as: ADVIL Take 1 tablet (600 mg total) by mouth every 6 (six) hours.   Prenatal Vitamin 27-0.8 MG  Tabs Take 1 Dose by mouth daily.   promethazine 25 MG tablet Commonly known as: PHENERGAN Take 1 tablet (25 mg total) by mouth every 6 (six) hours as needed for nausea.        Discharge home in stable condition Infant Feeding: Bottle Infant Disposition:home with mother Discharge instruction: per After Visit Summary and Postpartum booklet. Activity: Advance as tolerated. Pelvic rest for 6 weeks.  Diet: routine diet Future Appointments:No future appointments. Follow up Visit:  Patient to schedule f/u with [facility].   EMILY Madelin Headings, MD PGY-2 Resident Family Medicine 02/29/2020, 7:50 AM  CNM attestation I have seen and examined this patient and agree with above documentation in the resident's note.   Kaylee Gibson is a 34 y.o. G1P1001 s/p VAVD.   Pain is well controlled.  Plan for birth control is Depo-Provera.  Method of Feeding: bottle  PE:  BP 105/60 (BP Location: Right Arm)   Pulse 90   Temp (!) 97.5 F (36.4 C) (Oral)   Resp 18   Ht _0  (1.549 m)   Wt 60.2 kg   LMP 05/09/2019 (Approximate)   SpO2 100%   Breastfeeding Unknown   BMI 25.08 kg/m  Fundus firm  Recent Labs    02/26/20 1451  HGB 10.5*  HCT 34.3*     Plan: discharge today - postpartum care discussed - f/u clinic in 4-6 weeks for postpartum visit at the [facility], CNM 9:04 AM  02/29/2020

## 2020-02-27 NOTE — Progress Notes (Addendum)
LABOR PROGRESS NOTE  Kaylee Gibson is a 34 y.o. G1P0 at [redacted]w[redacted]d  admitted for IOL due to post dates.  Subjective: Patient much more comfortable and tolerating labor after epidural.   Objective: BP (!) 146/74   Pulse (!) 117   Temp 99.6 F (37.6 C) (Axillary)   Resp 17   Ht 5\' 1"  (1.549 m)   Wt 60.2 kg   LMP 05/09/2019 (Approximate)   SpO2 100%   BMI 25.08 kg/m  or  Vitals:   02/27/20 1130 02/27/20 1138 02/27/20 1200 02/27/20 1231  BP: 111/78 (!) 98/59 (!) 105/59 (!) 146/74  Pulse: 99 94 96 (!) 117  Resp:      Temp:      TempSrc:      SpO2: 100%     Weight:      Height:        Dilation: 10 Dilation Complete Date: 02/27/20 Dilation Complete Time: 0617 Effacement (%): 100 Cervical Position: Anterior Station: Plus 2, Plus 3 Presentation: Vertex Exam by:: stone rnc FHT: baseline rate 150, moderate varibility, positive acel, early decel Toco: every 3 mins  Labs: Lab Results  Component Value Date   WBC 5.7 02/26/2020   HGB 10.5 (L) 02/26/2020   HCT 34.3 (L) 02/26/2020   MCV 91.7 02/26/2020   PLT 383 02/26/2020    Patient Active Problem List   Diagnosis Date Noted  . Indication for care in labor and delivery, antepartum 02/26/2020  . S/P laparoscopic appendectomy 11/24/2019  . Acute appendicitis affecting pregnancy 11/23/2019  . Eczema 10/30/2016    Assessment / Plan: 34 y.o. G1P0 at [redacted]w[redacted]d here for IOL 2/2 post dates.  Labor: continue with pitocin, complete and pushing; will consider vacuum assist if no further descent or onset of maternal exhaustion  Fetal Wellbeing:  Category I, early decelerations  Pain Control: epidural  GBS: ppx with Ancef, adequate  Anticipated MOD:  Vaginal delivery    [redacted]w[redacted]d, MD Family Medicine, PGY-1  02/27/2020, 12:44 PM

## 2020-02-27 NOTE — Anesthesia Preprocedure Evaluation (Addendum)
Anesthesia Evaluation  Patient identified by MRN, date of birth, ID band Patient awake    Reviewed: Allergy & Precautions, NPO status , Patient's Chart, lab work & pertinent test results  Airway Mallampati: II  TM Distance: >3 FB Neck ROM: Full    Dental no notable dental hx. (+) Teeth Intact   Pulmonary neg pulmonary ROS,    Pulmonary exam normal breath sounds clear to auscultation       Cardiovascular negative cardio ROS Normal cardiovascular exam Rhythm:Regular Rate:Normal     Neuro/Psych negative neurological ROS  negative psych ROS   GI/Hepatic negative GI ROS, Neg liver ROS,   Endo/Other  negative endocrine ROS  Renal/GU negative Renal ROS     Musculoskeletal negative musculoskeletal ROS (+)   Abdominal   Peds  Hematology negative hematology ROS (+)   Anesthesia Other Findings   Reproductive/Obstetrics (+) Pregnancy                           Anesthesia Physical Anesthesia Plan  ASA: II  Anesthesia Plan: Epidural   Post-op Pain Management:    Induction:   PONV Risk Score and Plan:   Airway Management Planned:   Additional Equipment:   Intra-op Plan:   Post-operative Plan:   Informed Consent: I have reviewed the patients History and Physical, chart, labs and discussed the procedure including the risks, benefits and alternatives for the proposed anesthesia with the patient or authorized representative who has indicated his/her understanding and acceptance.       Plan Discussed with:   Anesthesia Plan Comments: (40.1 wk G1P0 for Lea . Epidural being placed when patient is 10 cm dilated for arrest of labor . Pt declined placement of epidural at 5;45 and also at 0750 this AM. Called back at 1030 for epidural placement. When asked why she refused it 2x before she said that she was scared.  I then asked her about any concerns  or had she heard something about the epidural.  She just said that she was scared but wanted an epidural at this time.)      Anesthesia Quick Evaluation

## 2020-02-28 MED ORDER — MEDROXYPROGESTERONE ACETATE 150 MG/ML IM SUSP
150.0000 mg | Freq: Once | INTRAMUSCULAR | Status: AC
  Administered 2020-02-29: 150 mg via INTRAMUSCULAR
  Filled 2020-02-28: qty 1

## 2020-02-28 NOTE — Progress Notes (Signed)
CSW consulted as MOB presented with flat affect and a desire to evaluate mood. CSW went to speak with MOB at bedside to address further needs/concens.   CSW entered the room and noted that MOB was changing infants pamper. CSW asked for permission to enter the room in which MOB was agreeable. CSW advised MOB of CSW's role and the reason for CSW coming by to see her. MOB reported to this CSW that she has no mental health hx and reported that she has been taking classes during pregnancy to help her prepare for infant. MOB reported that she has been feeling well since she gave birth and reported no concerns to this CSW. CSW inquired from [facility] on who her supports are. MOB reported that FOB is her primary support. MOB expressed to this CSW that she has all items needed to care for infant and reported that she would go get milk for infant also. MOB reported to this CSW that she has been feeling fine since giving birth  CSW observed that MOB was very short in answer "yes and no questions:, however when CSW would engaged MOB in open end questions, MOB would answer questions appropriately.  CSW noted no concerns at this time. MOB reported to this CSW that she already has Ped for infant.   CSW took time to provide MOB with PPD and SIDS education. MOB was given PPD Checklist in order to keep track of feelings as they may relate to PPD. MOB thanked CSW and expressed no concerns to CSW.      Claude Manges Mellisa Arshad, MSW, LCSW [facility] [phone]

## 2020-02-28 NOTE — Progress Notes (Addendum)
Post Partum Day 1 s/p VAVD Subjective: up ad lib, voiding, tolerating PO, + flatus and reports passing a couple quarter-sized clots  Objective: Blood pressure 101/68, pulse (!) 111, temperature 98 F (36.7 C), temperature source Oral, resp. rate 18, height 5\' 1"  (1.549 m), weight 60.2 kg, last menstrual period 05/09/2019, SpO2 100 %, unknown if currently breastfeeding.  Physical Exam:  General: alert, cooperative and no distress Lochia: appropriate Uterine Fundus: firm DVT Evaluation: No evidence of DVT seen on physical exam.  Recent Labs    02/26/20 1451  HGB 10.5*  HCT 34.3*    Assessment/Plan: Plan for discharge tomorrow, Circumcision prior to discharge and Contraception depo   LOS: 2 days   EMILY M GREEN 02/28/2020, 7:20 AM   GME ATTESTATION:  I saw and evaluated the patient. I agree with the findings and the plan of care as documented in the resident's note.  04/29/2020, DO OB Fellow, Faculty [facility], Center for [facility] 02/28/2020 7:36 AM

## 2020-02-28 NOTE — Anesthesia Postprocedure Evaluation (Signed)
Anesthesia Post Note  Patient: Kaylee Gibson  Procedure(s) Performed: AN AD HOC LABOR EPIDURAL     Patient location during evaluation: Mother Baby Anesthesia Type: Epidural Level of consciousness: awake and alert and oriented Pain management: satisfactory to patient Vital Signs Assessment: post-procedure vital signs reviewed and stable Respiratory status: respiratory function stable Cardiovascular status: stable Postop Assessment: no headache, no backache, epidural receding, patient able to bend at knees, no signs of nausea or vomiting, adequate PO intake and no apparent nausea or vomiting Anesthetic complications: no    Last Vitals:  Vitals:   02/28/20 0434 02/28/20 0532  BP: 102/69 101/68  Pulse: 99 (!) 111  Resp: 18 18  Temp: 36.8 C 36.7 C  SpO2: 100%     Last Pain:  Vitals:   02/28/20 1028  TempSrc:   PainSc: 0-No pain   Pain Goal:                   Audon Heymann

## 2020-02-29 MED ORDER — IBUPROFEN 600 MG PO TABS: 600.0000 mg | ORAL_TABLET | Freq: Four times a day (QID) | ORAL | 0 refills | Status: DC

## 2020-02-29 NOTE — Discharge Instructions (Signed)

## 2020-03-03 ENCOUNTER — Inpatient Hospital Stay (HOSPITAL_COMMUNITY)
Admission: AD | Admit: 2020-03-03 | Payer: Medicaid Other | Source: Home / Self Care | Admitting: Obstetrics and Gynecology

## 2020-03-03 ENCOUNTER — Inpatient Hospital Stay (HOSPITAL_COMMUNITY): Payer: Medicaid Other

## 2020-05-23 ENCOUNTER — Encounter: Payer: Medicaid Other | Admitting: Student

## 2020-09-21 NOTE — L&D Delivery Note (Signed)
OB/GYN Faculty Practice Delivery Note  Kaylee Gibson is a 35 y.o. G2P1001 s/p SVD at [redacted]w[redacted]d. She was admitted for ROM and early labor.   ROM: 13h 68m with clear fluid GBS Status: Negative Maximum Maternal Temperature: 98.4  Labor Progress: Presented after rupture of membranes in early labor, was started on pitocin and progressed to complete.  Delivery Date/Time: 2106 on 11/24 Delivery: Called to room and patient was complete and pushing. Head delivered OA. No nuchal cord present. Shoulder and body delivered in usual fashion. Infant with spontaneous cry, placed on mother's abdomen, dried and stimulated. Cord clamped x 2 after 1-minute delay, and cut by father of baby. Cord blood drawn. Placenta delivered spontaneously with gentle cord traction. Fundus firm with massage and Pitocin. Labia, perineum, vagina, and cervix inspected and intact.   Placenta: intact, 3V cord, to L&D Complications: None Lacerations:  None EBL: 110cc Analgesia: Epidural    Infant: Female  APGARs 9,9  weight pending  Warner Mccreedy, MD, MPH OB Fellow, Faculty Practice Center for [facility], [facility] 08/14/2021, 9:22 PM    ;l

## 2021-01-30 LAB — OB RESULTS CONSOLE RUBELLA ANTIBODY, IGM: Rubella: IMMUNE

## 2021-01-30 LAB — OB RESULTS CONSOLE RPR: RPR: NONREACTIVE

## 2021-01-30 LAB — OB RESULTS CONSOLE HIV ANTIBODY (ROUTINE TESTING): HIV: NONREACTIVE

## 2021-01-30 LAB — OB RESULTS CONSOLE HEPATITIS B SURFACE ANTIGEN: Hepatitis B Surface Ag: NEGATIVE

## 2021-01-30 LAB — HEPATITIS C ANTIBODY: HCV Ab: NEGATIVE

## 2021-02-12 LAB — OB RESULTS CONSOLE GC/CHLAMYDIA: Gonorrhea: NEGATIVE

## 2021-05-11 IMAGING — US US MFM OB FOLLOW-UP
1 series · 12 of 28 positions shown · non-contrast
Comparison: none

[Series 1: us mfm ob follow-up · 12 of 143 slices shown]
[im 6/143]
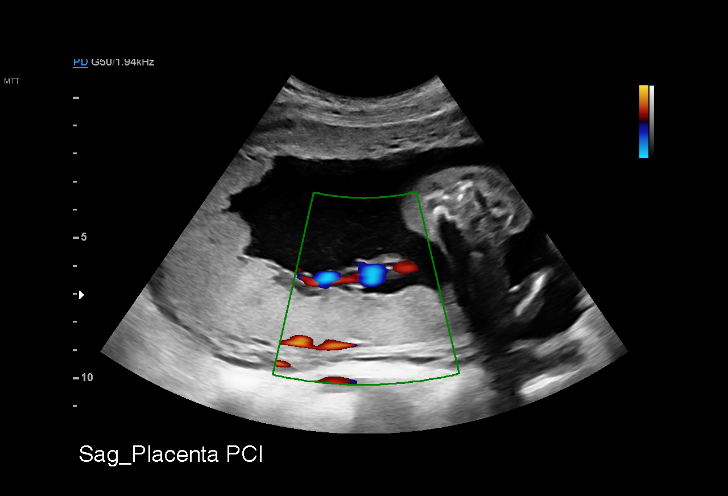
[im 16/143]
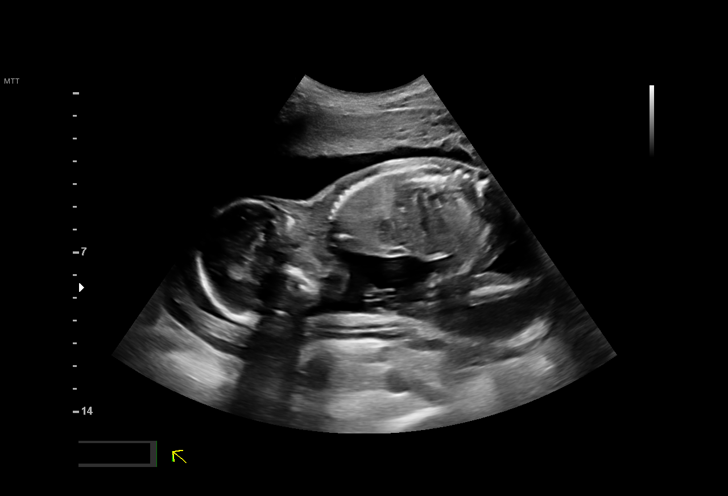
[im 27/143]
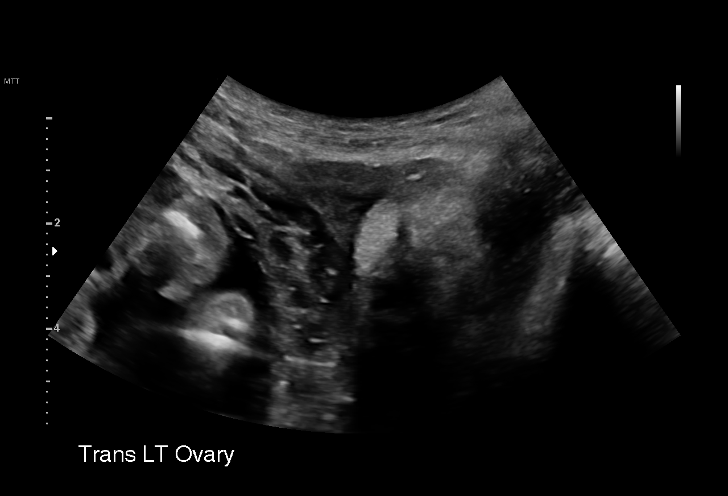
[im 43/143]
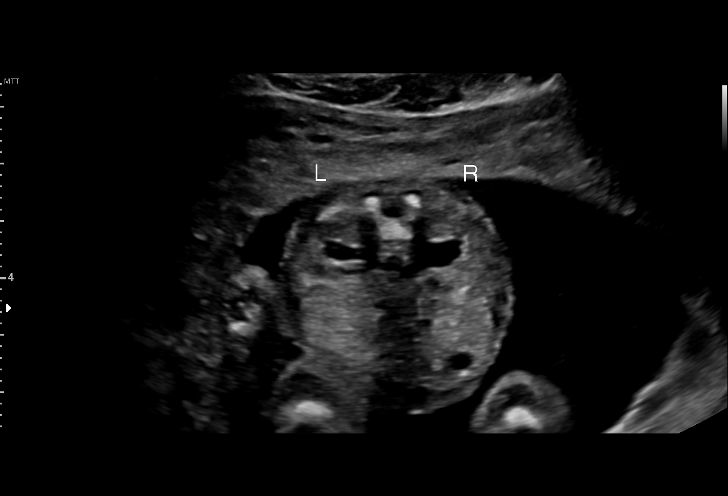
[im 53/143]
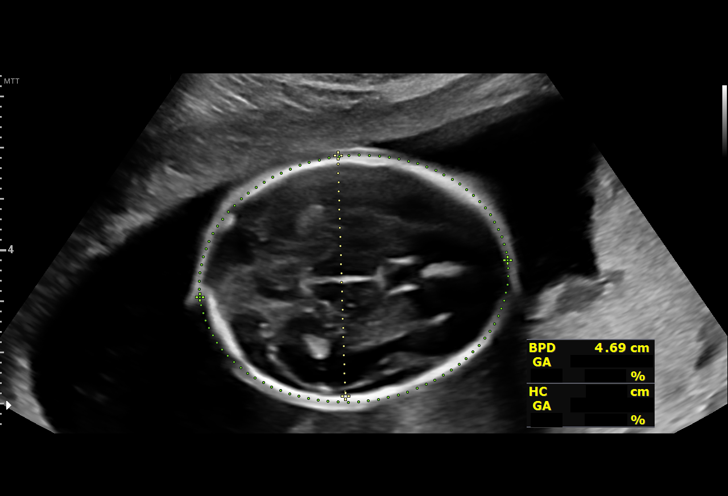
[im 64/143]
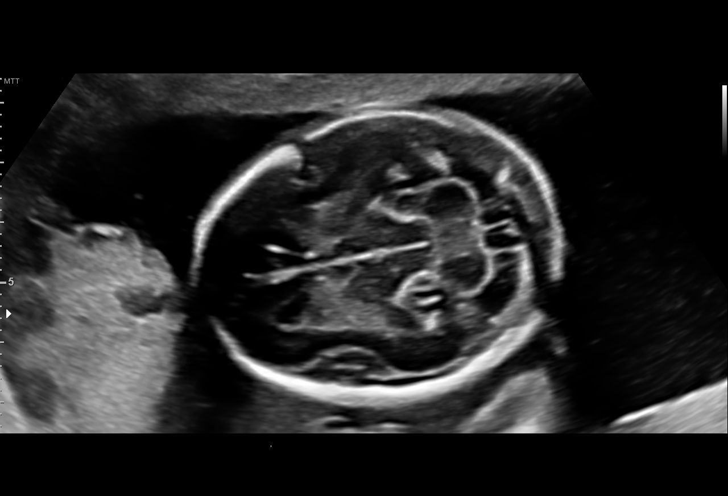
[im 79/143]
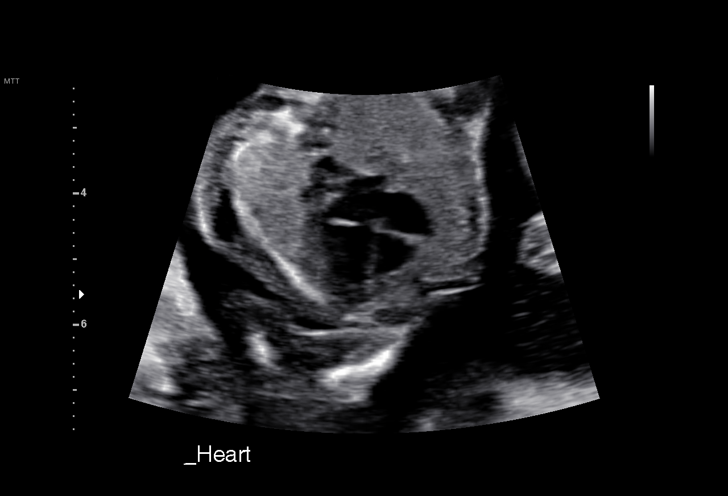
[im 90/143]
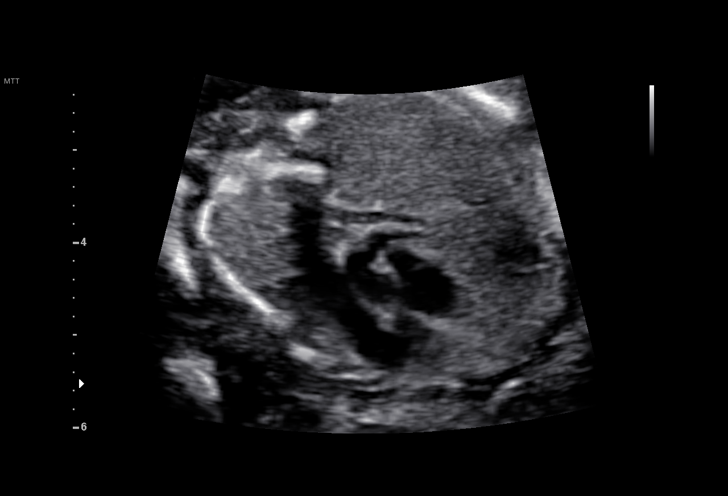
[im 100/143]
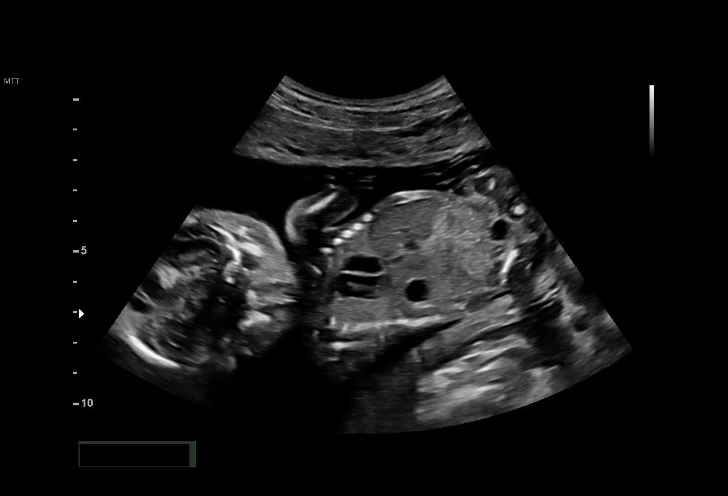
[im 116/143]
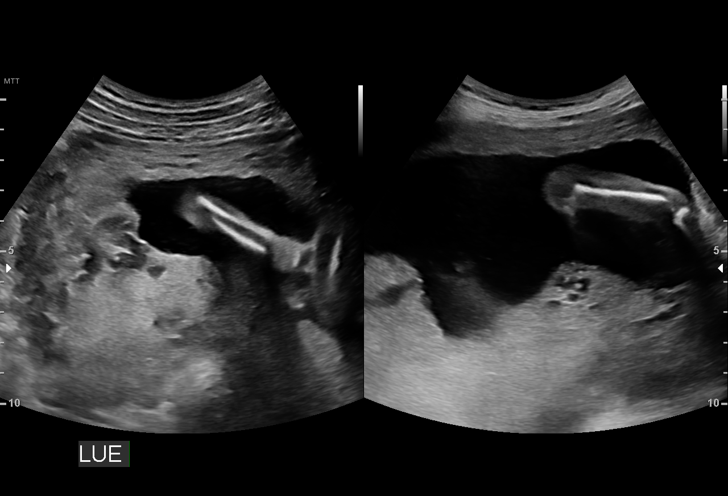
[im 127/143]
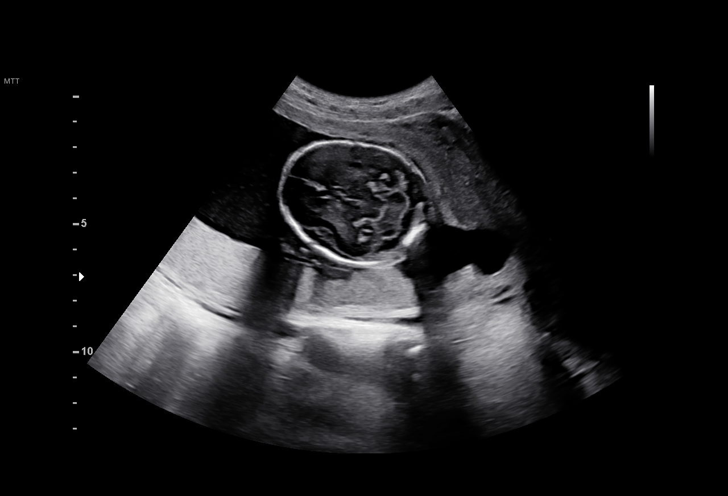
[im 137/143]
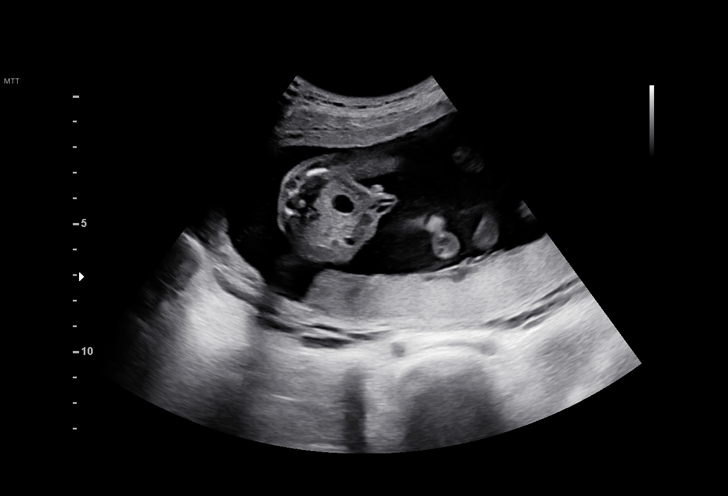

[12 of 28 positions shown; findings below may reference images not displayed]

[REDACTED]-
                   Faculty Physician

 ----------------------------------------------------------------------

 ----------------------------------------------------------------------
Indications

  Fetal abnormality - other known or suspected
  19 weeks gestation of pregnancy
  Antenatal follow-up for nonvisualized fetal
  anatomy
 ----------------------------------------------------------------------
Fetal Evaluation

 Num Of Fetuses:         1
 Fetal Heart Rate(bpm):  151
 Cardiac Activity:       Observed
 Presentation:           Breech
 Placenta:               Posterior
 P. Cord Insertion:      Previously Visualized

 Amniotic Fluid
 AFI FV:      Within normal limits

                             Largest Pocket(cm)

Biometry

 BPD:      46.3  mm     G. Age:  20w 0d         62  %    CI:        76.19   %    70 - 86
                                                         FL/HC:      17.8   %    16.8 -
 HC:      168.1  mm     G. Age:  19w 3d         30  %    HC/AC:      1.14        1.09 -
 AC:      146.9  mm     G. Age:  20w 0d         54  %    FL/BPD:     64.6   %
 FL:       29.9  mm     G. Age:  19w 2d         26  %    FL/AC:      20.4   %    20 - 24
 HUM:      29.9  mm     G. Age:  19w 6d         55  %
 CER:      20.2  mm     G. Age:  19w 1d         40  %
 NFT:       4.8  mm
 LV:        6.3  mm
 CM:        5.5  mm

 Est. FW:     304  gm    0 lb 11 oz      41  %
OB History

 Gravidity:    1         Term:   0        Prem:   0        SAB:   0
 TOP:          0       Ectopic:  0        Living: 0
Gestational Age

 LMP:           21w 3d        Date:  05/09/19                 EDD:   02/13/20
 U/S Today:     19w 5d                                        EDD:   02/25/20
 Best:          19w 5d     Det. By:  U/S C R L  (08/11/19)    EDD:   02/25/20
Anatomy

 Cranium:               Appears normal         Aortic Arch:            Appears normal
 Cavum:                 Appears normal         Ductal Arch:            Appears normal
 Ventricles:            Appears normal         Diaphragm:              Appears normal
 Choroid Plexus:        Appears normal         Stomach:                Appears normal, left
                                                                       sided
 Cerebellum:            Appears normal         Abdomen:                Appears normal
 Posterior Fossa:       Appears normal         Abdominal Wall:         Appears nml (cord
                                                                       insert, abd wall)
 Nuchal Fold:           Appears normal         Cord Vessels:           Appears normal (3
                                                                       vessel cord)
 Face:                  Appears normal         Kidneys:                Right sided
                        (orbits and profile)
                                                                       pyelectasis,
                                                                       mm
 Lips:                  Appears normal         Bladder:                Appears normal
 Thoracic:              Appears normal         Spine:                  Appears normal
 Heart:                 Appears normal         Upper Extremities:      Appears normal
                        (4CH, axis, and
                        situs)
 RVOT:                  Appears normal         Lower Extremities:      Appears normal
 LVOT:                  Appears normal

 Other:  5th digit visualized previously.  Hands visualized previously. Nasal
         bone visualized.
Cervix Uterus Adnexa

 Cervix
 Length:           3.37  cm.
 Normal appearance by transabdominal scan.

 Uterus
 No abnormality visualized.

 Left Ovary
 Cystic teratoma 1.3 x 1.3 x 1.1 cm

 Right Ovary
 Not visualized. No adnexal mass visualized.

 Cul De Sac
 No free fluid seen.
 Adnexa
 No abnormality visualized.
Comments

 This patient was seen for a fetal anatomy scan.  A possible
 anterior abdominal wall defect was noted during her first
 trimester ultrasound.  The anterior abdominal wall defect was
 ruled out on her last ultrasound exam.  She denies any
 problems since her last exam.
 The patient just had a cell free DNA test drawn yesterday.
 The results of that test are not available yet today.
 She was informed that the fetal growth and amniotic fluid
 level were appropriate for her gestational age.
 There were no signs of an anterior abdominal wall defect
 noted today.
 Mild right pyelectasis was noted on today's ultrasound exam.
 The patient was advised that the mild pylectasis noted today
 is most likely a normal variant.  However, there may also be
 other processes causing this finding that may require
 treatment after birth.
 The association between pyelectasis and Down syndrome
 was discussed.  Due to this finding, the patient was advised
 regarding the increased risk of Down syndrome.  She was
 offered and declined an amniocentesis today for definitive
 diagnosis of fetal aneuploidy.  We will await the results of her
 cell free DNA test.  The patient was advised that we will
 continue to follow her closely throughout her pregnancy to
 assess this finding.
 The patient was informed that anomalies may be missed due
 to technical limitations. If the fetus is in a suboptimal position
 or maternal habitus is increased, visualization of the fetus in
 the maternal uterus may be impaired.
 A follow-up exam was scheduled in 5 weeks for assessment
 of the fetal kidneys.

## 2021-06-28 IMAGING — MR MR ABDOMEN W/O CM
5 of 8 series · 19 of 48 positions shown · non-contrast
Comparison: None.

CLINICAL DATA: Right lower quadrant abdominal pain.  Pregnancy.

EXAM:
MRI ABDOMEN AND PELVIS WITHOUT CONTRAST
TECHNIQUE: Multiplanar multisequence MR imaging of the abdomen and pelvis was
performed. No intravenous contrast was administered.

[Series 3: bSSFP · axial · 6.0mm · 0.86mm/px · z∈[-54,+128]mm · 5 of 27 slices shown (1 of 2)]
[im 1/27]
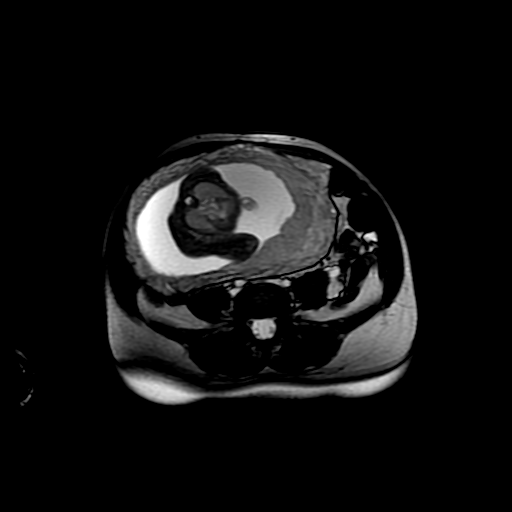
[im 7/27]
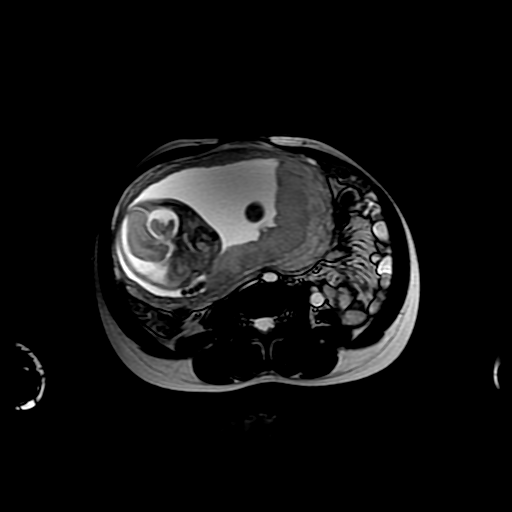
[im 14/27]
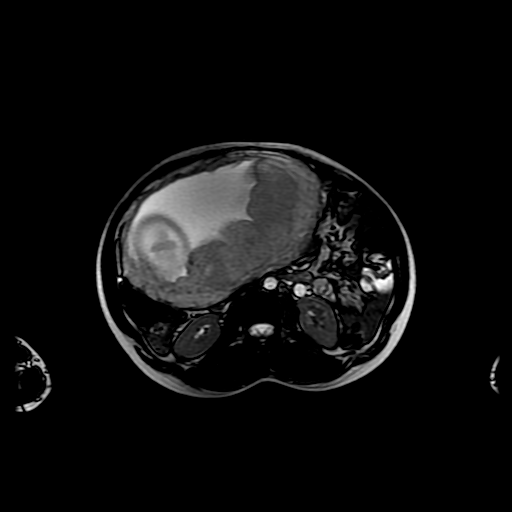
[im 20/27]
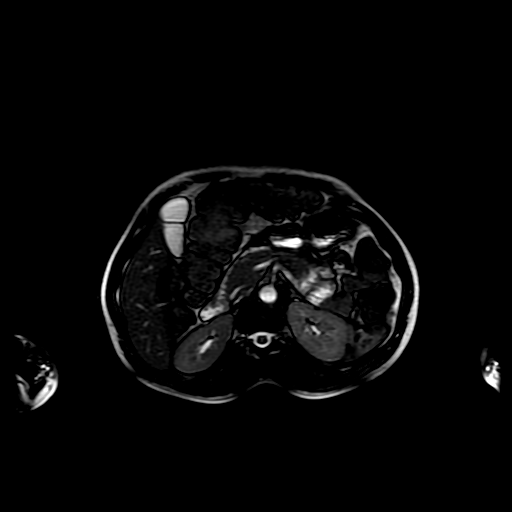
[im 27/27]
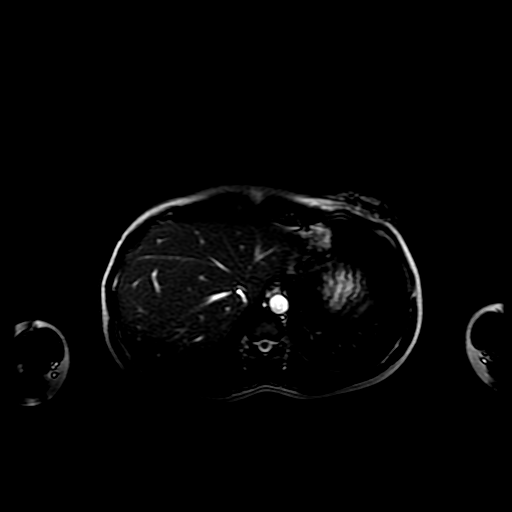

[Series 5: bSSFP · axial · 6.0mm · 0.86mm/px · z∈[-207,-46]mm · 4 of 24 slices shown (2 of 2)]
[im 1/24]
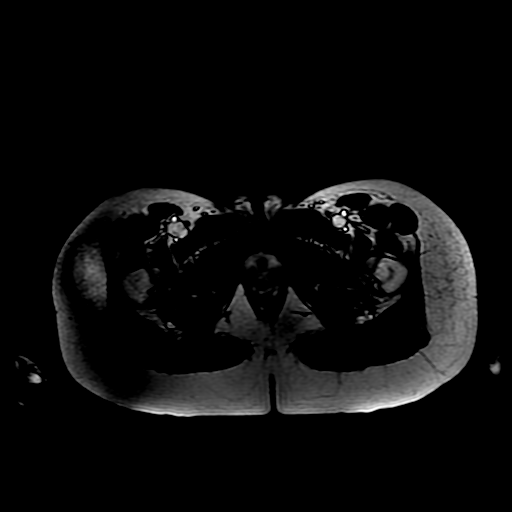
[im 8/24]
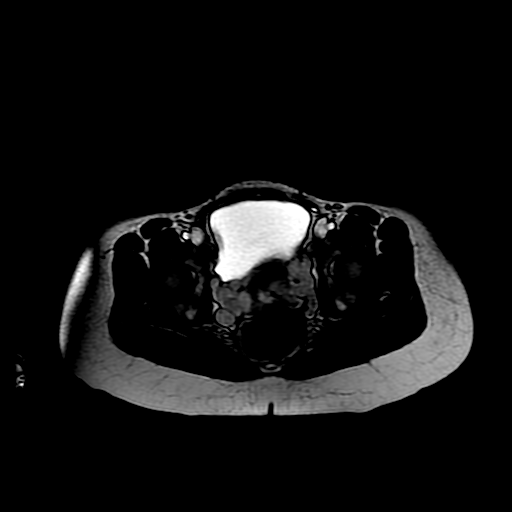
[im 16/24]
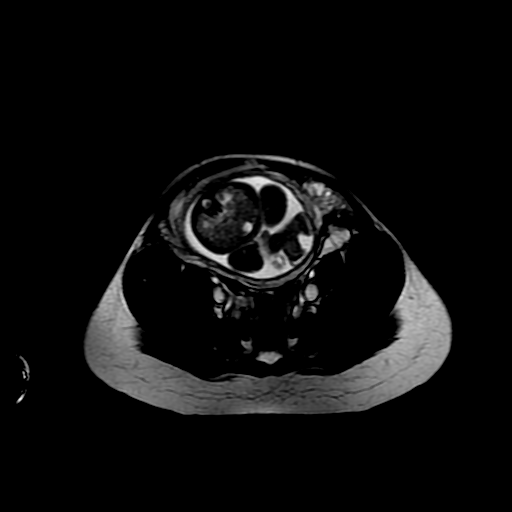
[im 24/24]
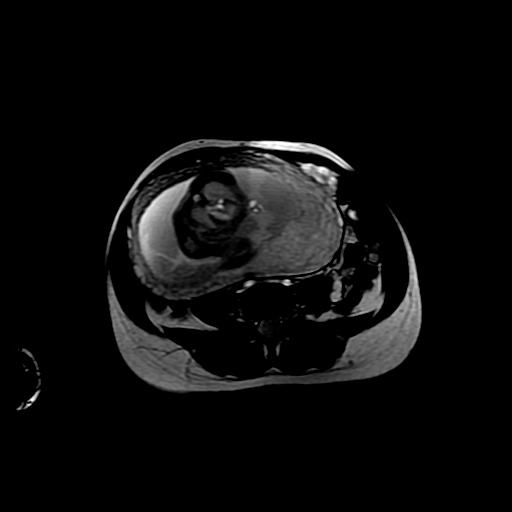

[Series 6: T2 · coronal · 10.0mm · 0.86mm/px · 3 of 22 slices shown]
[im 1/22]
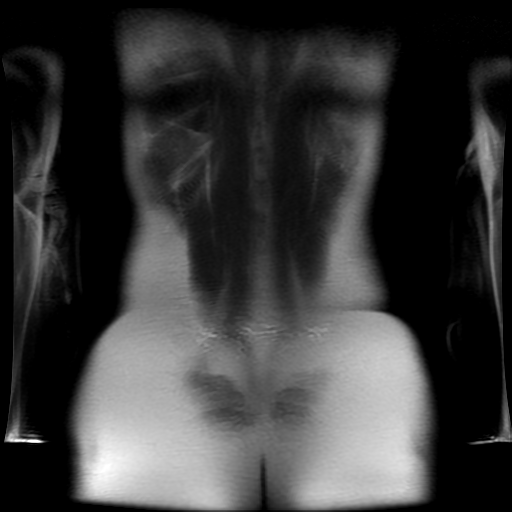
[im 11/22]
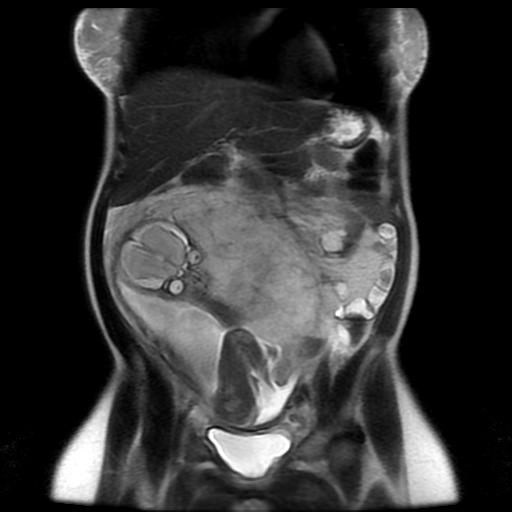
[im 22/22]
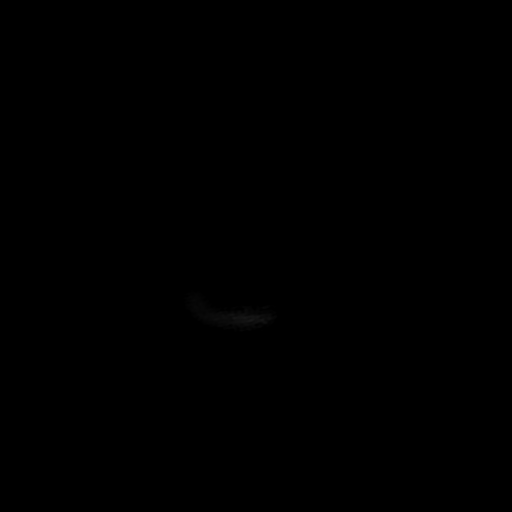

[Series 7: T2 fat-sat · coronal · 10.0mm · 0.86mm/px · 3 of 22 slices shown (1 of 2)]
[im 1/22]
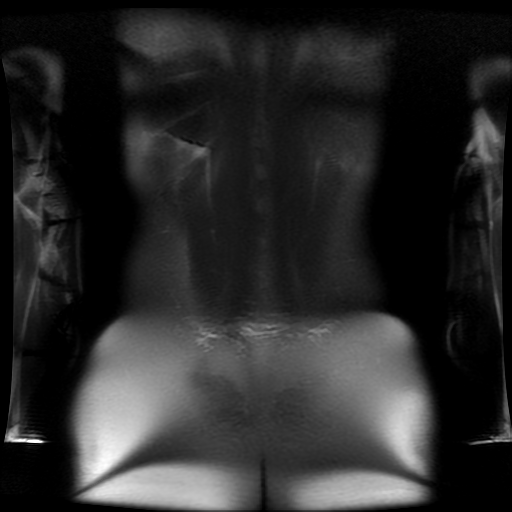
[im 11/22]
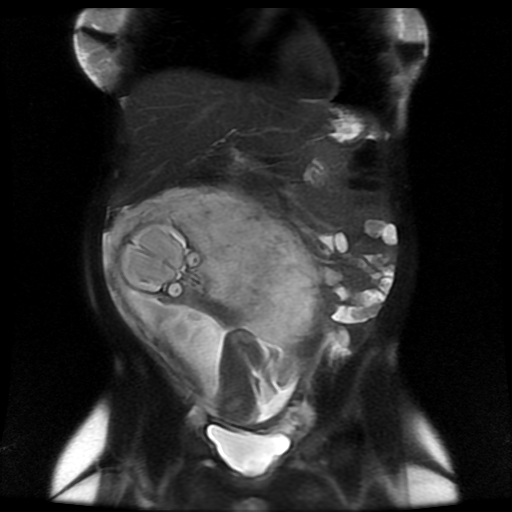
[im 22/22]
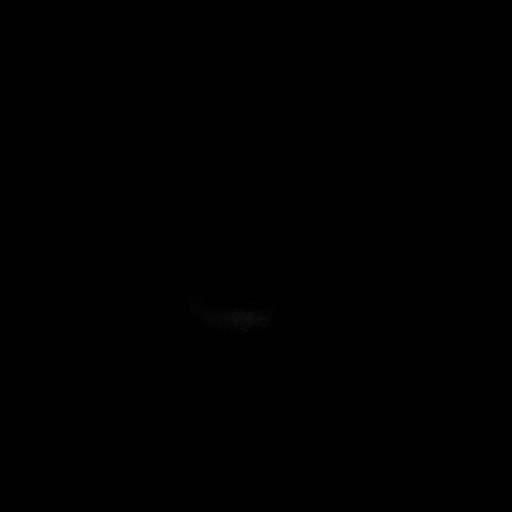

[Series 8: T2 fat-sat · axial · 10.0mm · 0.78mm/px · z∈[-207,+33]mm · 4 of 41 slices shown (2 of 2)]
[im 1/41]
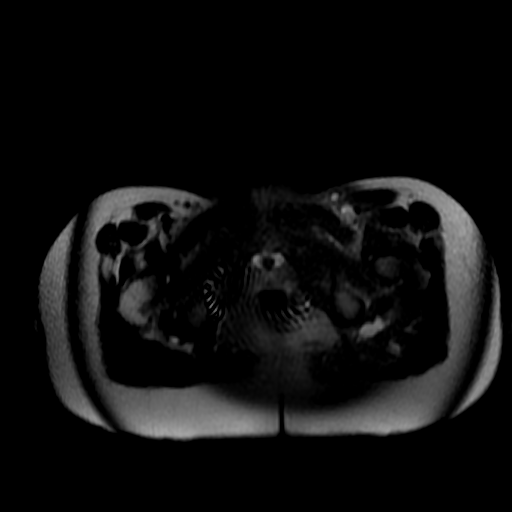
[im 9/41]
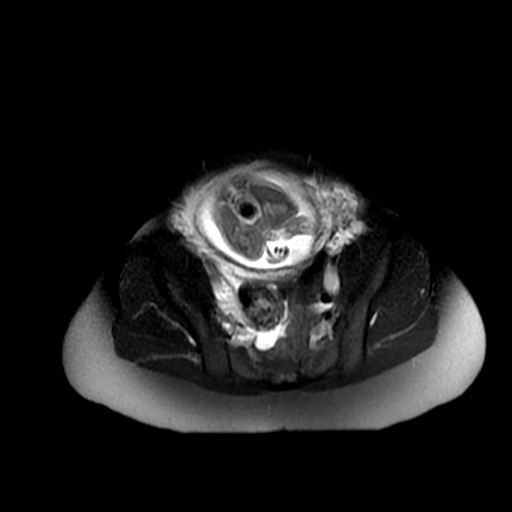
[im 17/41]
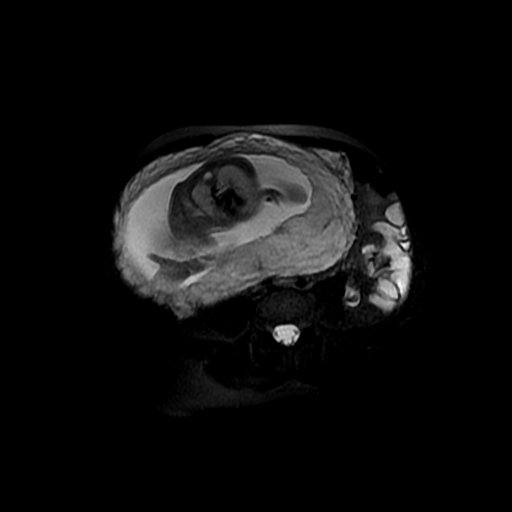
[im 25/41]
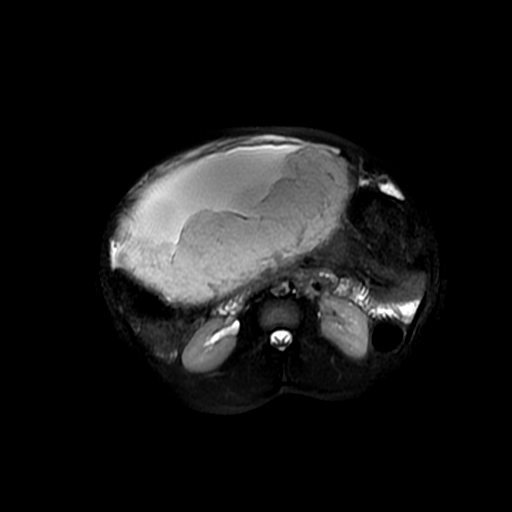

[19 of 48 positions shown; findings below may reference images not displayed]

FINDINGS: COMBINED FINDINGS FOR BOTH MR ABDOMEN AND PELVIS

Lower chest: Unremarkable

Hepatobiliary: Unremarkable

Pancreas:  Unremarkable

Spleen:  Unremarkable

Adrenals/Urinary Tract: Unremarkable. No hydronephrosis or
hydroureter.

Stomach/Bowel: A tubular structure believed to represent the
appendix measures 0.8 cm in diameter, mildly thickened. No
surrounding fluid signal or abscess. No obvious surrounding
inflammatory stranding.

No dilated bowel.

Vascular/Lymphatic:  Unremarkable

Reproductive: Single intrauterine pregnancy, breech presentation,
left posterior placenta without previa. No specific fetal
abnormality is identified, but please note that today's exam not a
dedicated fetal MRI.

Other:  No supplemental non-categorized findings.

Musculoskeletal: Unremarkable
IMPRESSION: 1. The appendix measures 0.8 cm in diameter which is mildly
abnormally thickened suspicious for early acute appendicitis.
However, there is no surrounding inflammatory stranding or fluid.
2. Single intrauterine pregnancy in breech presentation.

## 2021-07-28 LAB — OB RESULTS CONSOLE GBS: GBS: NEGATIVE

## 2021-08-14 ENCOUNTER — Inpatient Hospital Stay (HOSPITAL_COMMUNITY): Payer: Medicaid Other | Admitting: Anesthesiology

## 2021-08-14 ENCOUNTER — Other Ambulatory Visit: Payer: Self-pay

## 2021-08-14 ENCOUNTER — Inpatient Hospital Stay (HOSPITAL_COMMUNITY)
Admission: AD | Admit: 2021-08-14 | Discharge: 2021-08-16 | DRG: 807 | Disposition: A | Discharge status: Home / Self Care | Payer: Medicaid Other | Attending: Obstetrics & Gynecology | Admitting: Obstetrics & Gynecology

## 2021-08-14 ENCOUNTER — Encounter (HOSPITAL_COMMUNITY): Payer: Self-pay

## 2021-08-14 DIAGNOSIS — O99824 Streptococcus B carrier state complicating childbirth: Secondary | ICD-10-CM | POA: Diagnosis present

## 2021-08-14 DIAGNOSIS — O99619 Diseases of the digestive system complicating pregnancy, unspecified trimester: Secondary | ICD-10-CM | POA: Diagnosis present

## 2021-08-14 DIAGNOSIS — Z3A38 38 weeks gestation of pregnancy: Secondary | ICD-10-CM | POA: Diagnosis not present

## 2021-08-14 DIAGNOSIS — Z30017 Encounter for initial prescription of implantable subdermal contraceptive: Secondary | ICD-10-CM | POA: Diagnosis not present

## 2021-08-14 DIAGNOSIS — Z880 Allergy status to penicillin: Secondary | ICD-10-CM | POA: Diagnosis not present

## 2021-08-14 DIAGNOSIS — O26893 Other specified pregnancy related conditions, third trimester: Secondary | ICD-10-CM | POA: Diagnosis present

## 2021-08-14 DIAGNOSIS — O4202 Full-term premature rupture of membranes, onset of labor within 24 hours of rupture: Secondary | ICD-10-CM | POA: Diagnosis not present

## 2021-08-14 DIAGNOSIS — K3580 Unspecified acute appendicitis: Secondary | ICD-10-CM | POA: Diagnosis present

## 2021-08-14 DIAGNOSIS — O4292 Full-term premature rupture of membranes, unspecified as to length of time between rupture and onset of labor: Secondary | ICD-10-CM | POA: Diagnosis present

## 2021-08-14 DIAGNOSIS — Z20822 Contact with and (suspected) exposure to covid-19: Secondary | ICD-10-CM | POA: Diagnosis present

## 2021-08-14 DIAGNOSIS — O99820 Streptococcus B carrier state complicating pregnancy: Secondary | ICD-10-CM | POA: Diagnosis not present

## 2021-08-14 LAB — CBC
HCT: 31.4 % — ABNORMAL LOW (ref 36.0–46.0)
Hemoglobin: 9.7 g/dL — ABNORMAL LOW (ref 12.0–15.0)
MCH: 25.7 pg — ABNORMAL LOW (ref 26.0–34.0)
MCHC: 30.9 g/dL (ref 30.0–36.0)
MCV: 83.3 fL (ref 80.0–100.0)
Platelets: 351 10*3/uL (ref 150–400)
RBC: 3.77 MIL/uL — ABNORMAL LOW (ref 3.87–5.11)
RDW: 16.3 % — ABNORMAL HIGH (ref 11.5–15.5)
WBC: 5.2 10*3/uL (ref 4.0–10.5)
nRBC: 0 % (ref 0.0–0.2)

## 2021-08-14 LAB — POCT FERN TEST: POCT Fern Test: POSITIVE

## 2021-08-14 LAB — RESP PANEL BY RT-PCR (FLU A&B, COVID) ARPGX2
Influenza A by PCR: NEGATIVE
Influenza B by PCR: NEGATIVE
SARS Coronavirus 2 by RT PCR: NEGATIVE

## 2021-08-14 LAB — TYPE AND SCREEN
ABO/RH(D): O POS
Antibody Screen: NEGATIVE

## 2021-08-14 MED ORDER — DIPHENHYDRAMINE HCL 50 MG/ML IJ SOLN: 12.5000 mg | INTRAMUSCULAR | Status: DC | PRN

## 2021-08-14 MED ORDER — TETANUS-DIPHTH-ACELL PERTUSSIS 5-2.5-18.5 LF-MCG/0.5 IM SUSY: 0.5000 mL | PREFILLED_SYRINGE | Freq: Once | INTRAMUSCULAR | Status: DC

## 2021-08-14 MED ORDER — IBUPROFEN 600 MG PO TABS
600.0000 mg | ORAL_TABLET | Freq: Four times a day (QID) | ORAL | Status: DC
  Administered 2021-08-15 – 2021-08-16 (×6): 600 mg via ORAL
  Filled 2021-08-14 (×6): qty 1

## 2021-08-14 MED ORDER — MEDROXYPROGESTERONE ACETATE 150 MG/ML IM SUSP: 150.0000 mg | INTRAMUSCULAR | Status: DC | PRN

## 2021-08-14 MED ORDER — LACTATED RINGERS IV SOLN: 500.0000 mL | Freq: Once | INTRAVENOUS | Status: DC

## 2021-08-14 MED ORDER — DIBUCAINE (PERIANAL) 1 % EX OINT: 1.0000 "application " | TOPICAL_OINTMENT | CUTANEOUS | Status: DC | PRN

## 2021-08-14 MED ORDER — EPHEDRINE 5 MG/ML INJ: 10.0000 mg | INTRAVENOUS | Status: DC | PRN

## 2021-08-14 MED ORDER — ONDANSETRON HCL 4 MG PO TABS: 4.0000 mg | ORAL_TABLET | ORAL | Status: DC | PRN

## 2021-08-14 MED ORDER — TERBUTALINE SULFATE 1 MG/ML IJ SOLN: 0.2500 mg | Freq: Once | INTRAMUSCULAR | Status: DC | PRN

## 2021-08-14 MED ORDER — LIDOCAINE HCL (PF) 1 % IJ SOLN
INTRAMUSCULAR | Status: DC | PRN
  Administered 2021-08-14 (×2): 4 mL via EPIDURAL

## 2021-08-14 MED ORDER — MEASLES, MUMPS & RUBELLA VAC IJ SOLR: 0.5000 mL | Freq: Once | INTRAMUSCULAR | Status: DC

## 2021-08-14 MED ORDER — OXYTOCIN BOLUS FROM INFUSION: 333.0000 mL | Freq: Once | INTRAVENOUS | Status: DC

## 2021-08-14 MED ORDER — FENTANYL CITRATE (PF) 100 MCG/2ML IJ SOLN
50.0000 ug | INTRAMUSCULAR | Status: DC | PRN
Start: 2021-08-14 — End: 2021-08-14
  Administered 2021-08-14 (×2): 100 ug via INTRAVENOUS
  Filled 2021-08-14 (×2): qty 2

## 2021-08-14 MED ORDER — PHENYLEPHRINE 40 MCG/ML (10ML) SYRINGE FOR IV PUSH (FOR BLOOD PRESSURE SUPPORT)
80.0000 ug | PREFILLED_SYRINGE | INTRAVENOUS | Status: DC | PRN
  Administered 2021-08-14: 80 ug via INTRAVENOUS

## 2021-08-14 MED ORDER — LIDOCAINE HCL (PF) 1 % IJ SOLN: 30.0000 mL | INTRAMUSCULAR | Status: DC | PRN

## 2021-08-14 MED ORDER — FENTANYL-BUPIVACAINE-NACL 0.5-0.125-0.9 MG/250ML-% EP SOLN
12.0000 mL/h | EPIDURAL | Status: DC | PRN
  Filled 2021-08-14: qty 250

## 2021-08-14 MED ORDER — SIMETHICONE 80 MG PO CHEW: 80.0000 mg | CHEWABLE_TABLET | ORAL | Status: DC | PRN

## 2021-08-14 MED ORDER — CEFAZOLIN SODIUM-DEXTROSE 1-4 GM/50ML-% IV SOLN
1.0000 g | Freq: Three times a day (TID) | INTRAVENOUS | Status: DC
  Administered 2021-08-14: 1 g via INTRAVENOUS
  Filled 2021-08-14 (×2): qty 50

## 2021-08-14 MED ORDER — FENTANYL-BUPIVACAINE-NACL 0.5-0.125-0.9 MG/250ML-% EP SOLN
EPIDURAL | Status: DC | PRN
  Administered 2021-08-14: 12 mL/h via EPIDURAL

## 2021-08-14 MED ORDER — OXYTOCIN-SODIUM CHLORIDE 30-0.9 UT/500ML-% IV SOLN
2.5000 [IU]/h | INTRAVENOUS | Status: DC
  Filled 2021-08-14: qty 500

## 2021-08-14 MED ORDER — OXYTOCIN-SODIUM CHLORIDE 30-0.9 UT/500ML-% IV SOLN
1.0000 m[IU]/min | INTRAVENOUS | Status: DC
  Administered 2021-08-14: 2 m[IU]/min via INTRAVENOUS

## 2021-08-14 MED ORDER — CEFAZOLIN SODIUM-DEXTROSE 2-4 GM/100ML-% IV SOLN
2.0000 g | Freq: Once | INTRAVENOUS | Status: AC
  Administered 2021-08-14: 2 g via INTRAVENOUS
  Filled 2021-08-14: qty 100

## 2021-08-14 MED ORDER — ACETAMINOPHEN 325 MG PO TABS: 650.0000 mg | ORAL_TABLET | ORAL | Status: DC | PRN

## 2021-08-14 MED ORDER — COCONUT OIL OIL: 1.0000 "application " | TOPICAL_OIL | Status: DC | PRN

## 2021-08-14 MED ORDER — SENNOSIDES-DOCUSATE SODIUM 8.6-50 MG PO TABS
2.0000 | ORAL_TABLET | Freq: Every day | ORAL | Status: DC
  Administered 2021-08-15 – 2021-08-16 (×2): 2 via ORAL
  Filled 2021-08-14 (×2): qty 2

## 2021-08-14 MED ORDER — LACTATED RINGERS IV SOLN: INTRAVENOUS | Status: DC

## 2021-08-14 MED ORDER — ONDANSETRON HCL 4 MG/2ML IJ SOLN: 4.0000 mg | INTRAMUSCULAR | Status: DC | PRN

## 2021-08-14 MED ORDER — SOD CITRATE-CITRIC ACID 500-334 MG/5ML PO SOLN: 30.0000 mL | ORAL | Status: DC | PRN

## 2021-08-14 MED ORDER — WITCH HAZEL-GLYCERIN EX PADS: 1.0000 "application " | MEDICATED_PAD | CUTANEOUS | Status: DC | PRN

## 2021-08-14 MED ORDER — LACTATED RINGERS IV SOLN: 500.0000 mL | INTRAVENOUS | Status: DC | PRN

## 2021-08-14 MED ORDER — ONDANSETRON HCL 4 MG/2ML IJ SOLN
4.0000 mg | Freq: Four times a day (QID) | INTRAMUSCULAR | Status: DC | PRN
  Administered 2021-08-14: 4 mg via INTRAVENOUS
  Filled 2021-08-14: qty 2

## 2021-08-14 MED ORDER — HYDROXYZINE HCL 50 MG PO TABS: 50.0000 mg | ORAL_TABLET | Freq: Four times a day (QID) | ORAL | Status: DC | PRN

## 2021-08-14 MED ORDER — DIPHENHYDRAMINE HCL 25 MG PO CAPS: 25.0000 mg | ORAL_CAPSULE | Freq: Four times a day (QID) | ORAL | Status: DC | PRN

## 2021-08-14 MED ORDER — PRENATAL MULTIVITAMIN CH
1.0000 | ORAL_TABLET | Freq: Every day | ORAL | Status: DC
  Administered 2021-08-15 – 2021-08-16 (×2): 1 via ORAL
  Filled 2021-08-14 (×2): qty 1

## 2021-08-14 MED ORDER — FLEET ENEMA 7-19 GM/118ML RE ENEM: 1.0000 | ENEMA | RECTAL | Status: DC | PRN

## 2021-08-14 MED ORDER — BENZOCAINE-MENTHOL 20-0.5 % EX AERO
1.0000 "application " | INHALATION_SPRAY | CUTANEOUS | Status: DC | PRN
  Administered 2021-08-15: 1 via TOPICAL
  Filled 2021-08-14: qty 56

## 2021-08-14 MED ORDER — ACETAMINOPHEN 325 MG PO TABS
650.0000 mg | ORAL_TABLET | ORAL | Status: DC | PRN
  Administered 2021-08-15: 650 mg via ORAL
  Filled 2021-08-14: qty 2

## 2021-08-14 MED ORDER — PHENYLEPHRINE 40 MCG/ML (10ML) SYRINGE FOR IV PUSH (FOR BLOOD PRESSURE SUPPORT)
80.0000 ug | PREFILLED_SYRINGE | INTRAVENOUS | Status: DC | PRN
  Filled 2021-08-14: qty 10

## 2021-08-14 NOTE — H&P (Signed)
Kaylee Gibson is a 35 y.o. female G2P1001 with IUP at [redacted]w[redacted]d by LMP/US presenting for lROM around 0800, clear, and contractions. She reports positive fetal movement. She denies vaginal bleeding.  Prenatal History/Complications: PNC at HD Pregnancy complications:  - Past Medical History: Past Medical History:  Diagnosis Date   Eczema     Past Surgical History: Past Surgical History:  Procedure Laterality Date   APPENDECTOMY     LAPAROSCOPIC APPENDECTOMY N/A 11/23/2019   Procedure: APPENDECTOMY LAPAROSCOPIC;  Surgeon: Diamantina Monks, MD;  Location: [facility] OR;  Service: General;  Laterality: N/A;    Obstetrical History: OB History     Gravida  2   Para  1   Term  1   Preterm      AB      Living  1      SAB      IAB      Ectopic      Multiple  0   Live Births  1            Social History: Social History   Socioeconomic History   Marital status: Single    Spouse name: Not on file   Number of children: Not on file   Years of education: Not on file   Highest education level: Not on file  Occupational History   Not on file  Tobacco Use   Smoking status: Never   Smokeless tobacco: Never  Vaping Use   Vaping Use: Never used  Substance and Sexual Activity   Alcohol use: No    Alcohol/week: 0.0 standard drinks   Drug use: No   Sexual activity: Yes    Birth control/protection: None  Other Topics Concern   Not on file  Social History Narrative   Not on file   Social Determinants of Health   Financial Resource Strain: Not on file  Food Insecurity: Not on file  Transportation Needs: Not on file  Physical Activity: Not on file  Stress: Not on file  Social Connections: Not on file    Family History: Family History  Problem Relation Age of Onset   Asthma Mother    Asthma Father    Brain cancer Brother        Neuroblastoma    Diabetes Maternal Aunt     Allergies: Allergies  Allergen Reactions   Feraheme [Ferumoxytol] Nausea And  Vomiting   Oxycodone Nausea And Vomiting   Penicillins Rash    Did it involve swelling of the face/tongue/throat, SOB, or low BP? No Did it involve sudden or severe rash/hives, skin peeling, or any reaction on the inside of your mouth or nose? No Did you need to seek medical attention at a hospital or doctor's office? Yes When did it last happen?  2020     If all above answers are "NO", may proceed with cephalosporin use.     Medications Prior to Admission  Medication Sig Dispense Refill Last Dose   Prenatal Vit-Fe Fumarate-FA (PRENATAL VITAMIN) 27-0.8 MG TABS Take 1 Dose by mouth daily. 90 tablet 2 08/13/2021   acetaminophen (TYLENOL) 325 MG tablet Take 650 mg by mouth every 6 (six) hours as needed for mild pain or headache.      ibuprofen (ADVIL) 600 MG tablet Take 1 tablet (600 mg total) by mouth every 6 (six) hours. 30 tablet 0    promethazine (PHENERGAN) 25 MG tablet Take 1 tablet (25 mg total) by mouth every 6 (six) hours as needed for  nausea. 30 tablet 1     Review of Systems   Constitutional: Negative for fever and chills Eyes: Negative for visual disturbances Respiratory: Negative for shortness of breath, dyspnea Cardiovascular: Negative for chest pain or palpitations  Gastrointestinal: Negative for vomiting, diarrhea and constipation.  POSITIVE for abdominal pain (contractions) Genitourinary: Negative for dysuria and urgency Musculoskeletal: Negative for back pain, joint pain, myalgias  Neurological: Negative for dizziness and headaches  Blood pressure 106/74, pulse (!) 101, temperature 98.4 F (36.9 C), temperature source Oral, resp. rate 15, SpO2 100 %, unknown if currently breastfeeding. General appearance: alert, cooperative, and no distress Lungs: normal respiratory effort Heart: regular rate and rhythm Abdomen: soft, non-tender; bowel sounds normal Extremities: Homans sign is negative, no sign of DVT DTR's 2+ Presentation: cephalic Fetal monitoring  Baseline:  130 bpm, Variability: Good {> 6 bpm), Accelerations: Reactive, and Decelerations: Absent Uterine activity  irregular and mild, says are getting stronger Exam by:: Sharen Counter, CNM   Prenatal labs: ABO, Rh: --/--/O POS (11/24 1005) Antibody: NEG (11/24 1005) Rubella: Immune (05/12 0000) RPR: Nonreactive (05/12 0000)  HBsAg: Negative (05/12 0000)  HIV: Non-reactive (05/12 0000)  GBS: Negative/-- (11/07 0000)  1 hr Glucola 91 Genetic screening  neg Anatomy US normal boy  Prenatal Transfer Tool  Maternal Diabetes: No Genetic Screening: Normal Maternal Ultrasounds/Referrals: Normal Fetal Ultrasounds or other Referrals:  None Maternal Substance Abuse:  No Significant Maternal Medications:  None Significant Maternal Lab Results: Group B Strep positive  Results for orders placed or performed during the hospital encounter of 08/14/21 (from the past 24 hour(s))  POCT fern test   Collection Time: 08/14/21  9:59 AM  Result Value Ref Range   POCT Fern Test Positive = ruptured amniotic membanes   CBC   Collection Time: 08/14/21 10:05 AM  Result Value Ref Range   WBC 5.2 4.0 - 10.5 K/uL   RBC 3.77 (L) 3.87 - 5.11 MIL/uL   Hemoglobin 9.7 (L) 12.0 - 15.0 g/dL   HCT 17.6 (L) 16.0 - 73.7 %   MCV 83.3 80.0 - 100.0 fL   MCH 25.7 (L) 26.0 - 34.0 pg   MCHC 30.9 30.0 - 36.0 g/dL   RDW 10.6 (H) 26.9 - 48.5 %   Platelets 351 150 - 400 K/uL   nRBC 0.0 0.0 - 0.2 %  Type and screen [facility]   Collection Time: 08/14/21 10:05 AM  Result Value Ref Range   ABO/RH(D) O POS    Antibody Screen NEG    Sample Expiration      08/17/2021,2359 Performed at [facility], [address], [place], [place] [address]   Resp Panel by RT-PCR (Flu A&B, Covid) Nasopharyngeal Swab   Collection Time: 08/14/21 10:07 AM   Specimen: Nasopharyngeal Swab; Nasopharyngeal(NP) swabs in vial transport medium  Result Value Ref Range   SARS Coronavirus 2 by RT PCR NEGATIVE NEGATIVE    Influenza A by PCR NEGATIVE NEGATIVE   Influenza B by PCR NEGATIVE NEGATIVE    Assessment: Kaylee Gibson is a 34 y.o. G2P1001 with an IUP at [redacted]w[redacted]d presenting for ROM, early labor  Plan: #Labor: expectant management until GBS ppx complete #Pain:  Per request #FWB Cat 1 #ID: GBS: [id number], ancef  #MOF:  breast #MOC: INpt nexplanon #Circ: yes   Jacklyn Shell 08/14/2021, 2:16 PM

## 2021-08-14 NOTE — Anesthesia Preprocedure Evaluation (Signed)
Anesthesia Evaluation  Patient identified by MRN, date of birth, ID band Patient awake    Reviewed: Allergy & Precautions, Patient's Chart, lab work & pertinent test results  History of Anesthesia Complications Negative for: history of anesthetic complications  Airway Mallampati: II  TM Distance: >3 FB Neck ROM: Full    Dental no notable dental hx.    Pulmonary neg pulmonary ROS,    Pulmonary exam normal        Cardiovascular negative cardio ROS Normal cardiovascular exam     Neuro/Psych negative neurological ROS  negative psych ROS   GI/Hepatic negative GI ROS, Neg liver ROS,   Endo/Other  negative endocrine ROS  Renal/GU negative Renal ROS  negative genitourinary   Musculoskeletal  (+) Arthritis ,   Abdominal   Peds  Hematology  (+) anemia , Hgb 9.7   Anesthesia Other Findings Day of surgery medications reviewed with patient.  Reproductive/Obstetrics negative OB ROS                             Anesthesia Physical Anesthesia Plan  ASA: 2  Anesthesia Plan: Epidural   Post-op Pain Management:    Induction:   PONV Risk Score and Plan: Treatment may vary due to age or medical condition  Airway Management Planned: Natural Airway  Additional Equipment: Fetal Monitoring  Intra-op Plan:   Post-operative Plan:   Informed Consent: I have reviewed the patients History and Physical, chart, labs and discussed the procedure including the risks, benefits and alternatives for the proposed anesthesia with the patient or authorized representative who has indicated his/her understanding and acceptance.       Plan Discussed with:   Anesthesia Plan Comments:         Anesthesia Quick Evaluation

## 2021-08-14 NOTE — Discharge Summary (Signed)
Postpartum Discharge Summary      Patient Name: Kaylee Gibson DOB: 06-Mar-1986 MRN: [id number]  Date of admission: 08/14/2021 Delivery date:08/14/2021  Delivering provider: Renard Matter  Date of discharge: 08/16/2021  Admitting diagnosis: Indication for care in labor or delivery [O75.9] Intrauterine pregnancy: [redacted]w[redacted]d    Secondary diagnosis:  Principal Problem:   Indication for care in labor or delivery Active Problems:   Acute appendicitis affecting pregnancy   Vaginal delivery  Additional problems: None    Discharge diagnosis: Term Pregnancy Delivered                                              Post partum procedures: Nexplanon insertion Augmentation: Pitocin Complications: None  Hospital course: Onset of Labor With Vaginal Delivery      35y.o. yo G2P1001 at 384w4das admitted in Latent Labor and PROM on 08/14/2021. Patient had an uncomplicated labor course as follows:  Membrane Rupture Time/Date: 8:00 AM ,08/14/2021   Delivery Method:Vaginal, Spontaneous  Episiotomy: None  Lacerations:  None  Patient had an uncomplicated postpartum course.  She is ambulating, tolerating a regular diet, passing flatus, and urinating well. Patient is discharged home in stable condition on 08/16/21.  Newborn Data: Birth date:08/14/2021  Birth time:9:06 PM  Gender:Female  Living status:Living  Apgars:9 ,9  Weight:2750 g   Magnesium Sulfate received: No BMZ received: No Rhophylac:N/A MMR:N/A T-DaP:Given prenatally Flu: No Transfusion:No  Physical exam  Vitals:   08/15/21 0915 08/15/21 1310 08/15/21 1945 08/16/21 0530  BP: 103/69 99/72 110/76 101/71  Pulse: 87 88 93 98  Resp: _0 Temp: 98.4 F (36.9 C) 97.9 F (36.6 C) 97.6 F (36.4 C) 97.6 F (36.4 C)  TempSrc: Oral Oral Oral Oral  SpO2: 100% 100%  100%  Weight:      Height:       General: alert Lochia: appropriate Uterine Fundus: firm DVT Evaluation: No evidence of DVT seen on physical exam. Labs: Lab  Results  Component Value Date   WBC 5.2 08/14/2021   HGB 9.7 (L) 08/14/2021   HCT 31.4 (L) 08/14/2021   MCV 83.3 08/14/2021   PLT 351 08/14/2021   CMP Latest Ref Rng & Units 11/24/2019  Glucose 70 - 99 mg/dL 108(H)  BUN 6 - 20 mg/dL 5(L)  Creatinine 0.44 - 1.00 mg/dL 0.64  Sodium 135 - 145 mmol/L 136  Potassium 3.5 - 5.1 mmol/L 3.7  Chloride 98 - 111 mmol/L 102  CO2 22 - 32 mmol/L 23  Calcium 8.9 - 10.3 mg/dL 8.5(L)  Total Protein 6.5 - 8.1 g/dL -  Total Bilirubin 0.3 - 1.2 mg/dL -  Alkaline Phos 38 - 126 U/L -  AST 15 - 41 U/L -  ALT 0 - 44 U/L -   Edinburgh Score: Edinburgh Postnatal Depression Scale Screening Tool 08/15/2021  I have been able to laugh and see the funny side of things. 0  I have looked forward with enjoyment to things. 0  I have blamed myself unnecessarily when things went wrong. 0  I have been anxious or worried for no good reason. 0  I have felt scared or panicky for no good reason. 0  Things have been getting on top of me. 0  I have been so unhappy that I have had difficulty sleeping. 0  I have felt sad or  miserable. 0  I have been so unhappy that I have been crying. 0  The thought of harming myself has occurred to me. 0  Edinburgh Postnatal Depression Scale Total 0     After visit meds:  Allergies as of 08/16/2021       Reactions   Feraheme [ferumoxytol] Nausea And Vomiting   Oxycodone Nausea And Vomiting   Penicillins Rash   Did it involve swelling of the face/tongue/throat, SOB, or low BP? No Did it involve sudden or severe rash/hives, skin peeling, or any reaction on the inside of your mouth or nose? No Did you need to seek medical attention at a hospital or doctor's office? Yes When did it last happen?  2020     If all above answers are "NO", may proceed with cephalosporin use.        Medication List     STOP taking these medications    Prenatal Vitamin 27-0.8 MG Tabs   promethazine 25 MG tablet Commonly known as: PHENERGAN        TAKE these medications    acetaminophen 325 MG tablet Commonly known as: Tylenol Take 2 tablets (650 mg total) by mouth every 4 (four) hours as needed (for pain scale < 4). What changed:  when to take this reasons to take this   ibuprofen 600 MG tablet Commonly known as: ADVIL Take 1 tablet (600 mg total) by mouth every 6 (six) hours.         Discharge home in stable condition Infant Feeding: Bottle Infant Disposition:home with mother Discharge instruction: per After Visit Summary and Postpartum booklet. Activity: Advance as tolerated. Pelvic rest for 6 weeks.  Diet: routine diet Future Appointments:No future appointments. Follow up Visit: Patient made aware to call [facility] for appt  Please schedule this patient for a In person postpartum visit in 4 weeks with the following provider: Any provider. Additional Postpartum F/U: None    Low risk pregnancy complicated by:  None Delivery mode:  Vaginal, Spontaneous  Anticipated Birth Control:  PP Nexplanon    Renard Matter, MD, MPH OB Fellow, Faculty Practice

## 2021-08-14 NOTE — MAU Note (Signed)
.  Kaylee Gibson is a 35 y.o. at [redacted]w[redacted]d here in MAU reporting: water broke this morning at 0800, clear fluid. Denies VB. Endorses fetal movement. Irregular ctx. GBS pos, PCN allergy.    Pain score: 10

## 2021-08-14 NOTE — Progress Notes (Signed)
Patient Vitals for the past 4 hrs:  BP Pulse Height Weight  08/14/21 1554 (!) 101/58 98 -- --  08/14/21 1502 -- -- 5\' 1"  (1.549 m) 61.2 kg   Ctx are still mild.  Cx 3/50/-2.  FHR Cat 1.  Will start Pitocin

## 2021-08-14 NOTE — Anesthesia Procedure Notes (Signed)
Epidural Patient location during procedure: OB Start time: 08/14/2021 8:05 PM End time: 08/14/2021 8:08 PM  Staffing Anesthesiologist: Kaylyn Layer, MD Performed: anesthesiologist   Preanesthetic Checklist Completed: patient identified, IV checked, risks and benefits discussed, monitors and equipment checked, pre-op evaluation and timeout performed  Epidural Patient position: sitting Prep: DuraPrep and site prepped and draped Patient monitoring: continuous pulse ox, blood pressure and heart rate Approach: midline Location: L3-L4 Injection technique: LOR air  Needle:  Needle type: Tuohy  Needle gauge: 17 G Needle length: 9 cm Catheter type: closed end flexible Catheter size: 19 Gauge Catheter at skin depth: 9 cm Test dose: negative and Other (1% lidocaine)  Assessment Events: blood not aspirated, injection not painful, no injection resistance, no paresthesia and negative IV test  Additional Notes Patient identified. Risks, benefits, and alternatives discussed with patient including but not limited to bleeding, infection, nerve damage, paralysis, failed block, incomplete pain control, headache, blood pressure changes, nausea, vomiting, reactions to medication, itching, and postpartum back pain. Confirmed with bedside nurse the patient's most recent platelet count. Confirmed with patient that they are not currently taking any anticoagulation, have any bleeding history, or any family history of bleeding disorders. Patient expressed understanding and wished to proceed. All questions were answered. Sterile technique was used throughout the entire procedure. Please see nursing notes for vital signs.   Crisp LOR on first pass. Test dose was given through epidural catheter and negative prior to continuing to dose epidural or start infusion. Warning signs of high block given to the patient including shortness of breath, tingling/numbness in hands, complete motor block, or any concerning  symptoms with instructions to call for help. Patient was given instructions on fall risk and not to get out of bed. All questions and concerns addressed with instructions to call with any issues or inadequate analgesia.  Reason for block:procedure for pain

## 2021-08-15 LAB — RPR: RPR Ser Ql: NONREACTIVE

## 2021-08-15 NOTE — Anesthesia Postprocedure Evaluation (Signed)
Anesthesia Post Note  Patient: Kaylee Gibson  Procedure(s) Performed: AN AD HOC LABOR EPIDURAL     Patient location during evaluation: Mother Baby Anesthesia Type: Epidural Level of consciousness: awake Pain management: satisfactory to patient Vital Signs Assessment: post-procedure vital signs reviewed and stable Respiratory status: spontaneous breathing Cardiovascular status: stable Anesthetic complications: no   No notable events documented.  Last Vitals:  Vitals:   08/15/21 0040 08/15/21 0513  BP: 115/79 108/79  Pulse: (!) 101 92  Resp: 16 16  Temp: 36.9 C 36.8 C  SpO2: 100% 100%    Last Pain:  Vitals:   08/15/21 0513  TempSrc: Oral  PainSc: 9    Pain Goal: Patients Stated Pain Goal: 3 (08/15/21 0513)                 Cephus Shelling

## 2021-08-15 NOTE — Progress Notes (Signed)
Post Partum Day 1 Subjective: no complaints, up ad lib, voiding, tolerating PO, and + flatus  Objective: Blood pressure 103/69, pulse 87, temperature 98.4 F (36.9 C), temperature source Oral, resp. rate 16, height 5\' 1"  (1.549 m), weight 61.2 kg, SpO2 100 %, unknown if currently breastfeeding.  Physical Exam:  General: alert Lochia: appropriate Uterine Fundus: firm DVT Evaluation: No evidence of DVT seen on physical exam.  Recent Labs    08/14/21 1005  HGB 9.7*  HCT 31.4*    Assessment/Plan: Meeting all milestones at this time. Late evening delivery. Plan for discharge tomorrow -Does want circ for baby- consented    LOS: 1 day   08/16/21 08/15/2021, 10:12 AM

## 2021-08-16 ENCOUNTER — Encounter (HOSPITAL_COMMUNITY): Payer: Self-pay | Admitting: *Deleted

## 2021-08-16 DIAGNOSIS — Z30017 Encounter for initial prescription of implantable subdermal contraceptive: Secondary | ICD-10-CM | POA: Diagnosis not present

## 2021-08-16 MED ORDER — IBUPROFEN 600 MG PO TABS: 600.0000 mg | ORAL_TABLET | Freq: Four times a day (QID) | ORAL | 0 refills | Status: AC

## 2021-08-16 MED ORDER — ETONOGESTREL 68 MG ~~LOC~~ IMPL
68.0000 mg | DRUG_IMPLANT | Freq: Once | SUBCUTANEOUS | Status: AC
  Administered 2021-08-16: 68 mg via SUBCUTANEOUS
  Filled 2021-08-16: qty 1

## 2021-08-16 MED ORDER — ACETAMINOPHEN 325 MG PO TABS
650.0000 mg | ORAL_TABLET | ORAL | 0 refills | Status: AC | PRN
Start: 2021-08-16 — End: ?

## 2021-08-16 MED ORDER — LIDOCAINE HCL 1 % IJ SOLN
0.0000 mL | Freq: Once | INTRAMUSCULAR | Status: AC | PRN
  Administered 2021-08-16: 3 mL via INTRADERMAL
  Filled 2021-08-16: qty 20

## 2021-08-16 NOTE — Procedures (Signed)
GYNECOLOGY PROCEDURE NOTE  Kaylee Gibson is a 36 y.o. G2P1001 requesting Nexplanon insertion. No gynecologic concerns.  Nexplanon Insertion Procedure Patient identified, informed consent performed, consent signed. Patient does understand that irregular bleeding is a very common side effect of this medication. She was advised to have backup contraception for one week after placement. Appropriate time out taken. Patient's left arm was prepped and draped in the usual sterile fashion. The insertion area was measured and marked. Patient was prepped with alcohol swab and then injected with 3 ml of 1% lidocaine. The area was then prepped with betadine. Nexplanon removed from packaging and device confirmed present within needle, then inserted full length of needle and withdrawn per handbook instructions. Nexplanon was able to palpated in the patient's arm; patient palpated the insert herself. There was minimal blood loss. Patient insertion site covered with steri strip, guaze, and a pressure bandage to reduce any bruising. The patient tolerated the procedure well and was given post procedure instructions.

## 2021-08-27 ENCOUNTER — Telehealth (HOSPITAL_COMMUNITY): Payer: Self-pay | Admitting: *Deleted

## 2021-08-27 NOTE — Telephone Encounter (Signed)
Mom reports feeling good. No concerns about herself at this time. EPDS= 2 (Hospital score= 0) Mom reports baby is doing well. Feeding, peeing, and pooping without difficulty. Safe sleep reviewed. Mom reports no concerns about baby at present.  Duffy Rhody, RN 08-27-2021 at 12:47pm

## 2024-01-07 ENCOUNTER — Telehealth: Admitting: Physician Assistant

## 2024-01-07 DIAGNOSIS — Z5321 Procedure and treatment not carried out due to patient leaving prior to being seen by health care provider: Secondary | ICD-10-CM

## 2024-01-07 NOTE — Progress Notes (Signed)
 The patient no-showed for appointment despite this provider sending direct link x 2 with no response and waiting for at least 10 minutes from appointment time for patient to join. They will be marked as a NS for this appointment/time.   Patient had arrived and left prior to appointment time. Patient was sent 2 separate links and provider waited over 10 minutes for the patient to return, which they did not. Will no charge, but mark left without being seen.  Delon CHRISTELLA Dickinson, PA-C
